# Patient Record
Sex: Female | Born: 1990 | Race: White | Hispanic: No | Marital: Single | Smoking: Never smoker
Health system: Southern US, Community
[De-identification: ages and names within clinical notes are randomized; demographics above are authoritative.]

## PROBLEM LIST (undated history)

## (undated) ENCOUNTER — Inpatient Hospital Stay (HOSPITAL_COMMUNITY): Payer: Self-pay

## (undated) DIAGNOSIS — B999 Unspecified infectious disease: Secondary | ICD-10-CM

## (undated) DIAGNOSIS — A599 Trichomoniasis, unspecified: Secondary | ICD-10-CM

## (undated) DIAGNOSIS — K219 Gastro-esophageal reflux disease without esophagitis: Secondary | ICD-10-CM

## (undated) HISTORY — PX: NO PAST SURGERIES: SHX2092

---

## 2015-08-30 ENCOUNTER — Encounter (HOSPITAL_COMMUNITY): Payer: Self-pay | Admitting: Emergency Medicine

## 2015-08-30 ENCOUNTER — Emergency Department (HOSPITAL_COMMUNITY): Payer: Medicaid Other

## 2015-08-30 ENCOUNTER — Emergency Department (HOSPITAL_COMMUNITY)
Admission: EM | Admit: 2015-08-30 | Discharge: 2015-08-30 | Disposition: A | Discharge status: Home / Self Care | Payer: Medicaid Other | Attending: Emergency Medicine | Admitting: Emergency Medicine

## 2015-08-30 DIAGNOSIS — M549 Dorsalgia, unspecified: Secondary | ICD-10-CM | POA: Diagnosis not present

## 2015-08-30 DIAGNOSIS — Z3A01 Less than 8 weeks gestation of pregnancy: Secondary | ICD-10-CM | POA: Diagnosis not present

## 2015-08-30 DIAGNOSIS — R109 Unspecified abdominal pain: Secondary | ICD-10-CM | POA: Insufficient documentation

## 2015-08-30 DIAGNOSIS — R110 Nausea: Secondary | ICD-10-CM | POA: Diagnosis not present

## 2015-08-30 DIAGNOSIS — Z3490 Encounter for supervision of normal pregnancy, unspecified, unspecified trimester: Secondary | ICD-10-CM

## 2015-08-30 DIAGNOSIS — O9989 Other specified diseases and conditions complicating pregnancy, childbirth and the puerperium: Secondary | ICD-10-CM | POA: Diagnosis not present

## 2015-08-30 DIAGNOSIS — R102 Pelvic and perineal pain: Secondary | ICD-10-CM | POA: Diagnosis not present

## 2015-08-30 LAB — URINALYSIS, ROUTINE W REFLEX MICROSCOPIC
Bilirubin Urine: NEGATIVE
Glucose, UA: NEGATIVE mg/dL
Hgb urine dipstick: NEGATIVE
Ketones, ur: 15 mg/dL — AB
Nitrite: NEGATIVE
Protein, ur: 30 mg/dL — AB
Specific Gravity, Urine: 1.02 (ref 1.005–1.030)
pH: 8 (ref 5.0–8.0)

## 2015-08-30 LAB — URINE MICROSCOPIC-ADD ON

## 2015-08-30 LAB — COMPREHENSIVE METABOLIC PANEL
ALT: 35 U/L (ref 14–54)
AST: 22 U/L (ref 15–41)
Albumin: 3.9 g/dL (ref 3.5–5.0)
Alkaline Phosphatase: 62 U/L (ref 38–126)
Anion gap: 8 (ref 5–15)
BUN: 5 mg/dL — ABNORMAL LOW (ref 6–20)
CO2: 22 mmol/L (ref 22–32)
Calcium: 9 mg/dL (ref 8.9–10.3)
Chloride: 108 mmol/L (ref 101–111)
Creatinine, Ser: 0.61 mg/dL (ref 0.44–1.00)
GFR calc Af Amer: >60 mL/min (ref >60)
GFR calc non Af Amer: >60 mL/min (ref >60)
Glucose, Bld: 91 mg/dL (ref 65–99)
Potassium: 3.6 mmol/L (ref 3.5–5.1)
Sodium: 138 mmol/L (ref 135–145)
Total Bilirubin: 0.6 mg/dL (ref 0.3–1.2)
Total Protein: 7 g/dL (ref 6.5–8.1)

## 2015-08-30 LAB — CBC
HCT: 40 % (ref 36.0–46.0)
Hemoglobin: 13.4 g/dL (ref 12.0–15.0)
MCH: 28.4 pg (ref 26.0–34.0)
MCHC: 33.5 g/dL (ref 30.0–36.0)
MCV: 84.7 fL (ref 78.0–100.0)
Platelets: 240 10*3/uL (ref 150–400)
RBC: 4.72 MIL/uL (ref 3.87–5.11)
RDW: 13.8 % (ref 11.5–15.5)
WBC: 7.3 10*3/uL (ref 4.0–10.5)

## 2015-08-30 LAB — LIPASE, BLOOD: Lipase: 29 U/L (ref 11–51)

## 2015-08-30 LAB — I-STAT BETA HCG BLOOD, ED (MC, WL, AP ONLY): I-stat hCG, quantitative: >2000 m[IU]/mL — ABNORMAL HIGH (ref <5)

## 2015-08-30 LAB — HCG, QUANTITATIVE, PREGNANCY: hCG, Beta Chain, Quant, S: 5378 m[IU]/mL — ABNORMAL HIGH (ref <5)

## 2015-08-30 MED ORDER — SODIUM CHLORIDE 0.9 % IV BOLUS (SEPSIS)
1000.0000 mL | Freq: Once | INTRAVENOUS | Status: AC
  Administered 2015-08-30: 1000 mL via INTRAVENOUS

## 2015-08-30 NOTE — Discharge Instructions (Signed)
Return here as needed.  [facility] with [facility].  Tylenol for pain

## 2015-08-30 NOTE — Progress Notes (Signed)
Entered in d/c instructions  Medicaid Covered Patient  USE THIS WEBSITE TO ASSIST WITH UNDERSTANDING YOUR COVERAGE, RENEW APPLICATION [place] Medicaid Transportation to Dr appts if you are have full Medicaid: [phone], [phone] or [phone] Transportation Supervisor [phone] As a Medicaid client you MUST contact DSS/SSI each time you change address, move to another [place] or another state to keep your address updated  [place]: [address] [place], [place] [address] [email or web address]

## 2015-08-30 NOTE — ED Notes (Signed)
Pt 6 weeks 4 days pregnant c/o nausea, diarrhea, and abdominal cramping to right abdomen radiating to back, worse at night, x 3 weeks.

## 2015-08-30 NOTE — ED Provider Notes (Signed)
CSN: [id number]     Arrival date & time 08/30/15  1135 History   First MD Initiated Contact with Patient 08/30/15 1347     Chief Complaint  Patient presents with  . Abdominal Cramping    pregnant and cramping     (Consider location/radiation/quality/duration/timing/severity/associated sxs/prior Treatment) HPI Patient presents to the emergency department with nausea and abdominal discomfort in the left lower pelvic region with pain in the back pain is worse at night.  Patient states that she did not take any medications prior to arrival for her symptoms.  Patient has not seen in GYN doctor about her current pregnancy.  Patient denies chest pain, shortness of breath, weakness, dizziness, headache, blurred vision, cough, fever, dysuria, incontinence, bloody stool, hematemesis, neck pain, rash, near syncope or syncope.  The patient states that palpation makes the pain worse History reviewed. No pertinent past medical history. History reviewed. No pertinent past surgical history. History reviewed. No pertinent family history. Social History  Substance Use Topics  . Smoking status: Never Smoker   . Smokeless tobacco: None  . Alcohol Use: No   OB History    Gravida Para Term Preterm AB TAB SAB Ectopic Multiple Living   2         1     Review of Systems  All other systems negative except as documented in the HPI. All pertinent positives and negatives as reviewed in the HPI.  Allergies  Review of patient's allergies indicates no known allergies.  Home Medications   Prior to Admission medications   Not on File   BP 115/77 mmHg  Pulse 58  Temp(Src) 98 F (36.7 C) (Oral)  Resp 14  SpO2 100%  LMP 07/15/2015 Physical Exam  Constitutional: She is oriented to person, place, and time. She appears well-developed and well-nourished. No distress.  HENT:  Head: Normocephalic and atraumatic.  Mouth/Throat: Oropharynx is clear and moist.  Eyes: Pupils are equal, round, and reactive to  light.  Neck: Normal range of motion. Neck supple.  Cardiovascular: Normal rate, regular rhythm and normal heart sounds.  Exam reveals no gallop and no friction rub.   No murmur heard. Pulmonary/Chest: Effort normal and breath sounds normal. No respiratory distress. She has no wheezes.  Abdominal: Soft. Normal appearance and bowel sounds are normal. She exhibits no distension. There is tenderness. There is no rebound and no guarding.    Neurological: She is alert and oriented to person, place, and time. She exhibits normal muscle tone. Coordination normal.  Skin: Skin is warm and dry. No rash noted. No erythema.  Psychiatric: She has a normal mood and affect. Her behavior is normal.  Nursing note and vitals reviewed.   ED Course  Procedures (including critical care time) Labs Review Labs Reviewed  COMPREHENSIVE METABOLIC PANEL - Abnormal; Notable for the following:    BUN 5 (*)    All other components within normal limits  I-STAT BETA HCG BLOOD, ED ([facility], [facility], [facility] ONLY) - Abnormal; Notable for the following:    I-stat hCG, quantitative >2000.0 (*)    All other components within normal limits  LIPASE, BLOOD  CBC  URINALYSIS, ROUTINE W REFLEX MICROSCOPIC (NOT AT [facility])  HCG, QUANTITATIVE, PREGNANCY    Imaging Review US Ob Comp Less 14 Wks  08/30/2015  CLINICAL DATA:  24 year old female with abdominal and left pelvic pain in the first trimester pregnancy. Initial encounter. EXAM: OBSTETRIC <14 WK [place] AND TRANSVAGINAL OB US TECHNIQUE: Both transabdominal and transvaginal ultrasound examinations were performed  for complete evaluation of the gestation as well as the maternal uterus, adnexal regions, and pelvic cul-de-sac. Transvaginal technique was performed to assess early pregnancy. COMPARISON:  None. FINDINGS: Intrauterine gestational sac: Single Yolk sac:  Visible Embryo:  Not visible Cardiac Activity: Not detected MSD: 9.2  mm   5 w   5  d Maternal uterus/adnexae: Possible small volume of  subchorionic hemorrhage (image 52). Lobulated appearance of the dorsal fundus such that an intramural fibroid is possible measuring about 2.8 cm (image 27). Trace simple appearing pelvic free fluid. The right ovary appears normal containing a simple cyst with no vascular elements. The right ovary measures 3.1 x 2.3 x 2.5 cm. This cyst is 14 mm. The left ovary is normal measuring 1.4 by 2.7 x 1.7 cm. IMPRESSION: 1. Probable early intrauterine gestational sac with yolk sac, but no fetal pole, or cardiac activity yet visualized. Recommend follow-up quantitative B-HCG levels and follow-up US in 14 days to confirm and assess viability. This recommendation follows SRU consensus guidelines: Diagnostic Criteria for Nonviable Pregnancy Early in the First Trimester. [employer] Med 2013KT:048977. 2. Small volume of subchorionic hemorrhage. Trace simple appearing pelvic free fluid. Normal ovaries. 3. Questionable intramural dorsal uterine fibroid measuring about 2.8 cm. Electronically Signed   By: Genevie Ann M.D.   On: 08/30/2015 15:20   US Ob Transvaginal  08/30/2015  CLINICAL DATA:  24 year old female with abdominal and left pelvic pain in the first trimester pregnancy. Initial encounter. EXAM: OBSTETRIC <14 WK [place] AND TRANSVAGINAL OB US TECHNIQUE: Both transabdominal and transvaginal ultrasound examinations were performed for complete evaluation of the gestation as well as the maternal uterus, adnexal regions, and pelvic cul-de-sac. Transvaginal technique was performed to assess early pregnancy. COMPARISON:  None. FINDINGS: Intrauterine gestational sac: Single Yolk sac:  Visible Embryo:  Not visible Cardiac Activity: Not detected MSD: 9.2  mm   5 w   5  d Maternal uterus/adnexae: Possible small volume of subchorionic hemorrhage (image 52). Lobulated appearance of the dorsal fundus such that an intramural fibroid is possible measuring about 2.8 cm (image 27). Trace simple appearing pelvic free fluid. The right ovary appears  normal containing a simple cyst with no vascular elements. The right ovary measures 3.1 x 2.3 x 2.5 cm. This cyst is 14 mm. The left ovary is normal measuring 1.4 by 2.7 x 1.7 cm. IMPRESSION: 1. Probable early intrauterine gestational sac with yolk sac, but no fetal pole, or cardiac activity yet visualized. Recommend follow-up quantitative B-HCG levels and follow-up US in 14 days to confirm and assess viability. This recommendation follows SRU consensus guidelines: Diagnostic Criteria for Nonviable Pregnancy Early in the First Trimester. [employer] Med 2013KT:048977. 2. Small volume of subchorionic hemorrhage. Trace simple appearing pelvic free fluid. Normal ovaries. 3. Questionable intramural dorsal uterine fibroid measuring about 2.8 cm. Electronically Signed   By: Genevie Ann M.D.   On: 08/30/2015 15:20   I have personally reviewed and evaluated these images and lab results as part of my medical decision-making.  Patient is advised him to follow-up with the next 2 weeks for reevaluation.  Told to return here as needed.  Patient is advised to go to [facility] hospital for any worsening in her condition.  Patient is advised to use Tylenol, increase her fluid intake, rest as much possible.  Patient agrees the plan and all questions were answered    Dalia Heading, PA-C 08/30/15 [place], MD 08/31/15 [phone]

## 2015-08-30 NOTE — Progress Notes (Signed)
Pt states she was in [place]  then moved to [place] and now is in [place] [employer] but confirms she has not changed her medicaid pcp to a [place] pcp  Pt encouraged to Please contact the Dept of Social services case worker to have a local medicaid accepting primary care doctor entered pror to going to one from list CM offered her

## 2015-09-01 LAB — URINE CULTURE

## 2015-09-01 NOTE — L&D Delivery Note (Signed)
Delivery Note At  a viable female was delivered via  (Presentation:vertex ;LOA  ).  APGAR:9 ,9 ; weight  .   Placenta status: spont,via shultz .  Cord:3vc  with the following complications:none .  Cord pH: n/a  Anesthesia: none  Episiotomy:none   Lacerations:  none Suture Repair: n/a Est. Blood Loss 100 (mL):    Mom to postpartum.  Baby to Couplet care / Skin to Skin.  Koren Shiver 04/12/2016, 2:03 PM

## 2015-09-09 ENCOUNTER — Other Ambulatory Visit: Payer: Self-pay | Admitting: General Practice

## 2015-09-09 DIAGNOSIS — Z3490 Encounter for supervision of normal pregnancy, unspecified, unspecified trimester: Secondary | ICD-10-CM

## 2015-09-10 ENCOUNTER — Ambulatory Visit (HOSPITAL_COMMUNITY)
Admission: RE | Admit: 2015-09-10 | Discharge: 2015-09-10 | Disposition: A | Discharge status: Home / Self Care | Payer: Medicaid Other | Source: Ambulatory Visit | Attending: Family Medicine | Admitting: Family Medicine

## 2015-09-10 DIAGNOSIS — Z3490 Encounter for supervision of normal pregnancy, unspecified, unspecified trimester: Secondary | ICD-10-CM

## 2015-09-10 DIAGNOSIS — Z36 Encounter for antenatal screening of mother: Secondary | ICD-10-CM | POA: Insufficient documentation

## 2015-09-11 ENCOUNTER — Telehealth: Payer: Self-pay | Admitting: *Deleted

## 2015-09-11 NOTE — Telephone Encounter (Signed)
Pt called to see if her radiology results are available yet.

## 2015-09-12 ENCOUNTER — Ambulatory Visit (HOSPITAL_COMMUNITY): Payer: Medicaid Other

## 2015-09-12 NOTE — Telephone Encounter (Signed)
-----   Message from Truett Mainland, DO sent at 09/11/2015  1:34 PM EST ----- Normal [place] with Viable pregnancy.  Should establish care in the next 4 weeks.

## 2015-09-12 NOTE — Telephone Encounter (Signed)
Pt informed of results. Advised her that the front desk will call her with an appointment to start care.

## 2015-09-13 ENCOUNTER — Other Ambulatory Visit: Payer: Medicaid Other

## 2015-09-20 ENCOUNTER — Encounter (HOSPITAL_COMMUNITY): Payer: Self-pay | Admitting: *Deleted

## 2015-09-20 ENCOUNTER — Inpatient Hospital Stay (HOSPITAL_COMMUNITY)
Admission: AD | Admit: 2015-09-20 | Discharge: 2015-09-20 | Disposition: A | Discharge status: Home / Self Care | Payer: Medicaid Other | Source: Ambulatory Visit | Attending: Obstetrics and Gynecology | Admitting: Obstetrics and Gynecology

## 2015-09-20 DIAGNOSIS — Z3A09 9 weeks gestation of pregnancy: Secondary | ICD-10-CM | POA: Diagnosis not present

## 2015-09-20 DIAGNOSIS — O219 Vomiting of pregnancy, unspecified: Secondary | ICD-10-CM

## 2015-09-20 DIAGNOSIS — O210 Mild hyperemesis gravidarum: Secondary | ICD-10-CM | POA: Diagnosis present

## 2015-09-20 DIAGNOSIS — O98811 Other maternal infectious and parasitic diseases complicating pregnancy, first trimester: Secondary | ICD-10-CM | POA: Insufficient documentation

## 2015-09-20 DIAGNOSIS — A599 Trichomoniasis, unspecified: Secondary | ICD-10-CM | POA: Insufficient documentation

## 2015-09-20 LAB — URINE MICROSCOPIC-ADD ON

## 2015-09-20 LAB — URINALYSIS, ROUTINE W REFLEX MICROSCOPIC
BILIRUBIN URINE: NEGATIVE
GLUCOSE, UA: NEGATIVE mg/dL
KETONES UR: 15 mg/dL — AB
NITRITE: NEGATIVE
PH: 6.5 (ref 5.0–8.0)
Protein, ur: NEGATIVE mg/dL

## 2015-09-20 MED ORDER — DOXYLAMINE-PYRIDOXINE 10-10 MG PO TBEC: 1.0000 | DELAYED_RELEASE_TABLET | Freq: Every day | ORAL | Status: DC

## 2015-09-20 MED ORDER — METRONIDAZOLE 500 MG PO TABS: ORAL_TABLET | ORAL | Status: DC

## 2015-09-20 MED ORDER — PROMETHAZINE HCL 25 MG PO TABS: 25.0000 mg | ORAL_TABLET | Freq: Four times a day (QID) | ORAL | Status: DC | PRN

## 2015-09-20 NOTE — MAU Note (Signed)
Pt presents to MAU with complaints of nausea and vomiting for three weeks. Pt states she had an appointment in the clinic and didn't ask for nausea medications because she was going to try and not take anything but now is unable to keep anything down.

## 2015-09-20 NOTE — MAU Provider Note (Signed)
Chief Complaint: Morning Sickness Provider saw patient at  1830 hrs 09/20/15      SUBJECTIVE  HPI Meredith Delacruz is a 25 y.o. G2P0 at [redacted]w[redacted]d by LMP who presents to maternity admissions reporting nausea and vomiting.  Was offered meds in clinic but was trying to go without.  But now it is worse and she wants meds. . She denies vaginal bleeding, vaginal itching/burning, urinary symptoms, h/a, dizziness, n/v, or fever/chills.    RN Note: Pt presents to MAU with complaints of nausea and vomiting for three weeks. Pt states she had an appointment in the clinic and didn't ask for nausea medications because she was going to try and not take anything but now is unable to keep anything down.       No past medical history on file. No past surgical history on file. Social History   Social History  . Marital Status: Single    Spouse Name: N/A  . Number of Children: N/A  . Years of Education: N/A   Occupational History  . Not on file.   Social History Main Topics  . Smoking status: Never Smoker   . Smokeless tobacco: Not on file  . Alcohol Use: No  . Drug Use: No  . Sexual Activity: Yes    Birth Control/ Protection: None   Other Topics Concern  . Not on file   Social History Narrative   No current facility-administered medications on file prior to encounter.   No current outpatient prescriptions on file prior to encounter.   No Known Allergies  I have reviewed patient's Past Medical Hx, Surgical Hx, Family Hx, Social Hx, medications and allergies.   ROS:  Review of Systems Review of Systems  Constitutional: Negative for fever and chills.  Gastrointestinal: Positive for nausea, vomiting,Negative for abdominal pain, diarrhea and constipation.  Genitourinary: Negative for dysuria.  Musculoskeletal: Negative for back pain.  Neurological: Negative for dizziness and weakness.    Physical Exam  Patient Vitals for the past 24 hrs:  BP Temp Pulse Resp Height Weight  09/20/15  1756 118/82 mmHg 97.4 F (36.3 C) 68 18 5\' 1"  (1.549 m) 69.4 kg (153 lb)  Physical Exam   Constitutional: Well-developed, well-nourished female in no acute distress.  Cardiovascular: normal rate Respiratory: normal effort GI: Abd soft, non-tender. Pos BS x 4 MS: Extremities nontender, no edema, normal ROM Neurologic: Alert and oriented x 4.  GU: Neg CVAT. PELVIC EXAM: deferred     LAB RESULTS Results for orders placed or performed during the hospital encounter of 09/20/15 (from the past 24 hour(s))  Urinalysis, Routine w reflex microscopic (not at [facility])     Status: Abnormal   Collection Time: 09/20/15  6:00 PM  Result Value Ref Range   Color, Urine YELLOW YELLOW   APPearance CLEAR CLEAR   Specific Gravity, Urine <1.005 (L) 1.005 - 1.030   pH 6.5 5.0 - 8.0   Glucose, UA NEGATIVE NEGATIVE mg/dL   Hgb urine dipstick TRACE (A) NEGATIVE   Bilirubin Urine NEGATIVE NEGATIVE   Ketones, ur 15 (A) NEGATIVE mg/dL   Protein, ur NEGATIVE NEGATIVE mg/dL   Nitrite NEGATIVE NEGATIVE   Leukocytes, UA MODERATE (A) NEGATIVE  Urine microscopic-add on     Status: Abnormal   Collection Time: 09/20/15  6:00 PM  Result Value Ref Range   Squamous Epithelial / LPF 0-5 (A) NONE SEEN   WBC, UA 0-5 0 - 5 WBC/hpf   RBC / HPF 0-5 0 - 5 RBC/hpf  Bacteria, UA RARE (A) NONE SEEN   Urine-Other TRICHOMONAS PRESENT        IMAGING US Ob Comp Less 14 Wks  08/30/2015  CLINICAL DATA:  25 year old female with abdominal and left pelvic pain in the first trimester pregnancy. Initial encounter. EXAM: OBSTETRIC <14 WK [place] AND TRANSVAGINAL OB US TECHNIQUE: Both transabdominal and transvaginal ultrasound examinations were performed for complete evaluation of the gestation as well as the maternal uterus, adnexal regions, and pelvic cul-de-sac. Transvaginal technique was performed to assess early pregnancy. COMPARISON:  None. FINDINGS: Intrauterine gestational sac: Single Yolk sac:  Visible Embryo:  Not visible Cardiac  Activity: Not detected MSD: 9.2  mm   5 w   5  d Maternal uterus/adnexae: Possible small volume of subchorionic hemorrhage (image 52). Lobulated appearance of the dorsal fundus such that an intramural fibroid is possible measuring about 2.8 cm (image 27). Trace simple appearing pelvic free fluid. The right ovary appears normal containing a simple cyst with no vascular elements. The right ovary measures 3.1 x 2.3 x 2.5 cm. This cyst is 14 mm. The left ovary is normal measuring 1.4 by 2.7 x 1.7 cm. IMPRESSION: 1. Probable early intrauterine gestational sac with yolk sac, but no fetal pole, or cardiac activity yet visualized. Recommend follow-up quantitative B-HCG levels and follow-up US in 14 days to confirm and assess viability. This recommendation follows SRU consensus guidelines: Diagnostic Criteria for Nonviable Pregnancy Early in the First Trimester. [employer] Med 2013KT:048977. 2. Small volume of subchorionic hemorrhage. Trace simple appearing pelvic free fluid. Normal ovaries. 3. Questionable intramural dorsal uterine fibroid measuring about 2.8 cm. Electronically Signed   By: Genevie Ann M.D.   On: 08/30/2015 15:20   US Ob Transvaginal  09/10/2015  CLINICAL DATA:  Pregnancy.  Evaluate for viability. EXAM: OBSTETRIC <14 WK [place] AND TRANSVAGINAL OB US TECHNIQUE: Both transabdominal and transvaginal ultrasound examinations were performed for complete evaluation of the gestation as well as the maternal uterus, adnexal regions, and pelvic cul-de-sac. Transvaginal technique was performed to assess early pregnancy. COMPARISON:  08/30/2015. FINDINGS: Intrauterine gestational sac: Visualized/normal in shape. Yolk sac:  Present. Embryo:  Present. Cardiac Activity: Present. Heart Rate: 143  bpm CRL:  0.8 cm 6 w   5 d                  [place] EDC: 04/30/2016 Subchorionic hemorrhage:  None visualized. Maternal uterus/adnexae: No prominent subchorionic hemorrhage. Small corpus luteal cyst noted right ovary. IMPRESSION: Single  viable intrauterine pregnancy at 6 weeks 5 days. Electronically Signed   By: Marcello Moores  Register   On: 09/10/2015 13:52   US Ob Transvaginal  08/30/2015  CLINICAL DATA:  25 year old female with abdominal and left pelvic pain in the first trimester pregnancy. Initial encounter. EXAM: OBSTETRIC <14 WK [place] AND TRANSVAGINAL OB US TECHNIQUE: Both transabdominal and transvaginal ultrasound examinations were performed for complete evaluation of the gestation as well as the maternal uterus, adnexal regions, and pelvic cul-de-sac. Transvaginal technique was performed to assess early pregnancy. COMPARISON:  None. FINDINGS: Intrauterine gestational sac: Single Yolk sac:  Visible Embryo:  Not visible Cardiac Activity: Not detected MSD: 9.2  mm   5 w   5  d Maternal uterus/adnexae: Possible small volume of subchorionic hemorrhage (image 52). Lobulated appearance of the dorsal fundus such that an intramural fibroid is possible measuring about 2.8 cm (image 27). Trace simple appearing pelvic free fluid. The right ovary appears normal containing a simple cyst with no vascular elements. The  right ovary measures 3.1 x 2.3 x 2.5 cm. This cyst is 14 mm. The left ovary is normal measuring 1.4 by 2.7 x 1.7 cm. IMPRESSION: 1. Probable early intrauterine gestational sac with yolk sac, but no fetal pole, or cardiac activity yet visualized. Recommend follow-up quantitative B-HCG levels and follow-up US in 14 days to confirm and assess viability. This recommendation follows SRU consensus guidelines: Diagnostic Criteria for Nonviable Pregnancy Early in the First Trimester. [employer] Med [id number]. 2. Small volume of subchorionic hemorrhage. Trace simple appearing pelvic free fluid. Normal ovaries. 3. Questionable intramural dorsal uterine fibroid measuring about 2.8 cm. Electronically Signed   By: Genevie Ann M.D.   On: 08/30/2015 15:20    MAU Management/MDM: Ordered labs and reviewed results.  .  Discussed treatment options for  N/V. Will try Diclegis first and Phenergan as backup plan Discussed Trich and partner treatment Pt stable at time of discharge.  ASSESSMENT SIUP at [redacted]w[redacted]d  Nausea and vomiting of pregnancy Trichomonal infection  PLAN Discharge home Rx Diclegis with instructions for nausea Rx Phenergan to use if Diclegis does not work Rx Flagyl 2gm PO x 1 Expedited partner therapy Rx and instructions given    Medication List    Notice    You have not been prescribed any medications.       Hansel Feinstein CNM, MSN Certified Nurse-Midwife 09/20/2015  6:17 PM

## 2015-09-20 NOTE — Discharge Instructions (Signed)
Morning Sickness Morning sickness is when you feel sick to your stomach (nauseous) during pregnancy. This nauseous feeling may or may not come with vomiting. It often occurs in the morning but can be a problem any time of day. Morning sickness is most common during the first trimester, but it may continue throughout pregnancy. While morning sickness is unpleasant, it is usually harmless unless you develop severe and continual vomiting (hyperemesis gravidarum). This condition requires more intense treatment.  CAUSES  The cause of morning sickness is not completely known but seems to be related to normal hormonal changes that occur in pregnancy. RISK FACTORS You are at greater risk if you:  Experienced nausea or vomiting before your pregnancy.  Had morning sickness during a previous pregnancy.  Are pregnant with more than one baby, such as twins. TREATMENT  Do not use any medicines (prescription, over-the-counter, or herbal) for morning sickness without first talking to your health care provider. Your health care provider may prescribe or recommend:  Vitamin B6 supplements.  Anti-nausea medicines.  The herbal medicine ginger. HOME CARE INSTRUCTIONS   Only take over-the-counter or prescription medicines as directed by your health care provider.  Taking multivitamins before getting pregnant can prevent or decrease the severity of morning sickness in most women.  Eat a piece of dry toast or unsalted crackers before getting out of bed in the morning.  Eat five or six small meals a day.  Eat dry and bland foods (rice, baked potato). Foods high in carbohydrates are often helpful.  Do not drink liquids with your meals. Drink liquids between meals.  Avoid greasy, fatty, and spicy foods.  Get someone to cook for you if the smell of any food causes nausea and vomiting.  If you feel nauseous after taking prenatal vitamins, take the vitamins at night or with a snack.  Snack on protein  foods (nuts, yogurt, cheese) between meals if you are hungry.  Eat unsweetened gelatins for desserts.  Wearing an acupressure wristband (worn for sea sickness) may be helpful.  Acupuncture may be helpful.  Do not smoke.  Get a humidifier to keep the air in your house free of odors.  Get plenty of fresh air. SEEK MEDICAL CARE IF:   Your home remedies are not working, and you need medicine.  You feel dizzy or lightheaded.  You are losing weight. SEEK IMMEDIATE MEDICAL CARE IF:   You have persistent and uncontrolled nausea and vomiting.  You pass out (faint). MAKE SURE YOU:  Understand these instructions.  Will watch your condition.  Will get help right away if you are not doing well or get worse.   This information is not intended to replace advice given to you by your health care provider. Make sure you discuss any questions you have with your health care provider.   Document Released: 10/08/2006 Document Revised: 08/22/2013 Document Reviewed: 02/01/2013 Elsevier Interactive Patient Education 2016 [employer].  Trichomoniasis Trichomoniasis is an infection caused by an organism called Trichomonas. The infection can affect both women and men. In women, the outer female genitalia and the vagina are affected. In men, the penis is mainly affected, but the prostate and other reproductive organs can also be involved. Trichomoniasis is a sexually transmitted infection (STI) and is most often passed to another person through sexual contact.  RISK FACTORS  Having unprotected sexual intercourse.  Having sexual intercourse with an infected partner. SIGNS AND SYMPTOMS  Symptoms of trichomoniasis in women include:  Abnormal gray-green frothy vaginal discharge.  Itching and irritation of the vagina.  Itching and irritation of the area outside the vagina. Symptoms of trichomoniasis in men include:   Penile discharge with or without pain.  Pain during urination. This results  from inflammation of the urethra. DIAGNOSIS  Trichomoniasis may be found during a Pap test or physical exam. Your health care provider may use one of the following methods to help diagnose this infection:  Testing the pH of the vagina with a test tape.  Using a vaginal swab test that checks for the Trichomonas organism. A test is available that provides results within a few minutes.  Examining a urine sample.  Testing vaginal secretions. Your health care provider may test you for other STIs, including HIV. TREATMENT   You may be given medicine to fight the infection. Women should inform their health care provider if they could be or are pregnant. Some medicines used to treat the infection should not be taken during pregnancy.  Your health care provider may recommend over-the-counter medicines or creams to decrease itching or irritation.  Your sexual partner will need to be treated if infected.  Your health care provider may test you for infection again 3 months after treatment. HOME CARE INSTRUCTIONS   Take medicines only as directed by your health care provider.  Take over-the-counter medicine for itching or irritation as directed by your health care provider.  Do not have sexual intercourse while you have the infection.  Women should not douche or wear tampons while they have the infection.  Discuss your infection with your partner. Your partner may have gotten the infection from you, or you may have gotten it from your partner.  Have your sex partner get examined and treated if necessary.  Practice safe, informed, and protected sex.  See your health care provider for other STI testing. SEEK MEDICAL CARE IF:   You still have symptoms after you finish your medicine.  You develop abdominal pain.  You have pain when you urinate.  You have bleeding after sexual intercourse.  You develop a rash.  Your medicine makes you sick or makes you throw up (vomit). MAKE SURE  YOU:  Understand these instructions.  Will watch your condition.  Will get help right away if you are not doing well or get worse.   This information is not intended to replace advice given to you by your health care provider. Make sure you discuss any questions you have with your health care provider.   Document Released: 02/10/2001 Document Revised: 09/07/2014 Document Reviewed: 05/29/2013 Elsevier Interactive Patient Education 2016 [employer].  Expedited Partner Therapy:  Information Sheet for Patients and Partners               You have been offered expedited partner therapy (EPT). This information sheet contains important information and warnings you need to be aware of, so please read it carefully.   Expedited Partner Therapy (EPT) is the clinical practice of treating the sexual partners of persons who receive chlamydia, gonorrhea, or trichomoniasis diagnoses by providing medications or prescriptions to the patient. Patients then provide partners with these therapies without the health-care provider having examined the partner. In other words, EPT is a convenient, fast and private way for patients to help their sexual partners get treated.   Chlamydia and gonorrhea are bacterial infections you get from having sex with a person who is already infected. Trichomoniasis (or trich) is a very common sexually transmitted infection (STI) that is caused by infection with a protozoan  parasite called Trichomonas vaginalis.  Many people with these infections dont know it because they feel fine, but without treatment these infections can cause serious health problems, such as pelvic inflammatory disease, ectopic pregnancy, infertility and increased risk of HIV.   It is important to get treated as soon as possible to protect your health, to avoid spreading these infections to others, and to prevent yourself from becoming re-infected. The good news is these infections can be easily cured with  proper antibiotic medicine. The best way to take care of your self is to see a doctor or go to your local health department. If you are not able to see a doctor or other medical provider, you should take EPT.    Recommended Medication: EPT for Chlamydia:  Azithromycin (Zithromax) 1 gram orally in a single dose EPT for Gonorrhea:  Cefixime (Suprax) 400 milligrams orally in a single dose PLUS azithromycin (Zithromax) 1 gram orally in a single dose EPT for Trichomoniasis:  Metronidazole (Flagyl) 2 grams orally in a single dose   These medicines are very safe. However, you should not take them if you have ever had an allergic reaction (like a rash) to any of these medicines: azithromycin (Zithromax), erythromycin, clarithromycin (Biaxin), metronidazole (Flagyl), tinidazole (Tindimax). If you are uncertain about whether you have an allergy, call your medical provider or pharmacist before taking this medicine. If you have a serious, long-term illness like kidney, liver or heart disease, colitis or stomach problems, or you are currently taking other prescription medication, talk to your provider before taking this medication.   Women: If you have lower belly pain, pain during sex, vomiting, or a fever, do not take this medicine. Instead, you should see a medical provider to be certain you do not have pelvic inflammatory disease (PID). PID can be serious and lead to infertility, pregnancy problems or chronic pelvic pain.   Pregnant Women: It is very important for you to see a doctor to get pregnancy services and pre-natal care. These antibiotics for EPT are safe for pregnant women, but you still need to see a medical provider as soon as possible. It is also important to note that Doxycycline is an alternative therapy for chlamydia, but it should not be taken by someone who is pregnant.   Men: If you have pain or swelling in the testicles or a fever, do not take this medicine and see a medical provider.      Men who have sex with men (MSM): MSM in [place] continue to experience high rates of syphilis and HIV. Many MSM with gonorrhea or chlamydia could also have syphilis and/or HIV and not know it. If you are a man who has sex with other men, it is very important that you see a medical provider and are tested for HIV and syphilis. EPT is not recommended for gonorrhea for MSM.  Recommended treatment for gonorrhea for MSM is Rocephin (shot) AND azithromycin due to decreased cure rate.  Please see your medical provider if this is the case.    Along with this information sheet is a prescription for the medicine. If you receive a prescription it will be in your name and will indicate your date of birth, or it will be in the name of Expedited Partner Therapy.   In either case, you can have the prescription filled at a pharmacy. You will be responsible for the cost of the medicine, unless you have prescription drug coverage. In that case, you could provide your name  so the pharmacy could bill your health plan.   Take the medication as directed. Some people will have a mild, upset stomach, which does not last long. AVOID alcohol 24 hours after taking metronidazole (Flagyl) to reduce the possibility of a disulfiram-like reaction (severe vomiting and abdominal pain).  After taking the medicine, do not have sex for 7 days. Do not share this medicine or give it to anyone else. It is important to tell everyone you have had sex with in the last 60 days that they need to go and get tested for sexually transmitted infections.   Ways to prevent these and other sexually transmitted infections (STIs):    Abstain from sex. This is the only sure way to avoid getting an STI.   Use barrier methods, such as condoms, consistently and correctly.   Limit the number of sexual partners.   Have regular physical exams, including testing for STIs.   For more information about EPT or other issues pertaining to an STI,  please contact your medical provider or the [facility] Department at [phone] or [email or web address]

## 2015-09-24 ENCOUNTER — Encounter: Payer: Self-pay | Admitting: *Deleted

## 2015-10-08 ENCOUNTER — Ambulatory Visit (INDEPENDENT_AMBULATORY_CARE_PROVIDER_SITE_OTHER): Payer: Medicaid Other | Admitting: Advanced Practice Midwife

## 2015-10-08 ENCOUNTER — Encounter: Payer: Self-pay | Admitting: Advanced Practice Midwife

## 2015-10-08 ENCOUNTER — Encounter: Payer: Self-pay | Admitting: Family

## 2015-10-08 ENCOUNTER — Encounter: Payer: Medicaid Other | Admitting: Family

## 2015-10-08 VITALS — BP 115/82 | HR 89 | Temp 97.8°F | Wt 153.8 lb

## 2015-10-08 DIAGNOSIS — O23599 Infection of other part of genital tract in pregnancy, unspecified trimester: Secondary | ICD-10-CM

## 2015-10-08 DIAGNOSIS — Z113 Encounter for screening for infections with a predominantly sexual mode of transmission: Secondary | ICD-10-CM

## 2015-10-08 DIAGNOSIS — A5901 Trichomonal vulvovaginitis: Secondary | ICD-10-CM

## 2015-10-08 DIAGNOSIS — Z3481 Encounter for supervision of other normal pregnancy, first trimester: Secondary | ICD-10-CM

## 2015-10-08 DIAGNOSIS — O23591 Infection of other part of genital tract in pregnancy, first trimester: Secondary | ICD-10-CM

## 2015-10-08 DIAGNOSIS — Z124 Encounter for screening for malignant neoplasm of cervix: Secondary | ICD-10-CM

## 2015-10-08 LAB — POCT URINALYSIS DIP (DEVICE)
Bilirubin Urine: NEGATIVE
GLUCOSE, UA: NEGATIVE mg/dL
Hgb urine dipstick: NEGATIVE
Ketones, ur: NEGATIVE mg/dL
Nitrite: NEGATIVE
PROTEIN: NEGATIVE mg/dL
SPECIFIC GRAVITY, URINE: 1.015 (ref 1.005–1.030)
UROBILINOGEN UA: 0.2 mg/dL (ref 0.0–1.0)
pH: 6 (ref 5.0–8.0)

## 2015-10-08 NOTE — Progress Notes (Signed)
Here for prenatal visit. Declines flu shot.

## 2015-10-08 NOTE — Progress Notes (Signed)
Subjective:  Meredith Delacruz is a 25 y.o. G2P1001 at [redacted]w[redacted]d being seen today for initial prenatal visit.  She is currently monitored for the following issues for this low-risk pregnancy and has Supervision of normal pregnancy, antepartum and Trichomonal vaginitis during pregnancy on her problem list.  Patient reports no complaints.  Contractions: Not present. Vag. Bleeding: None.  Movement: Absent. Denies leaking of fluid.   The following portions of the patient's history were reviewed and updated as appropriate: allergies, current medications, past family history, past medical history, past social history, past surgical history and problem list. Problem list updated.  Objective:   Filed Vitals:   10/08/15 1241  BP: 115/82  Pulse: 89  Temp: 97.8 F (36.6 C)  Weight: 153 lb 12.8 oz (69.763 kg)    Fetal Status: Fetal Heart Rate (bpm): 166   Movement: Absent     General:  Alert, oriented and cooperative. Patient is in no acute distress.  Skin: Skin is warm and dry. No rash noted.   Cardiovascular: Normal heart rate noted  Respiratory: Normal respiratory effort, no problems with respiration noted  Abdomen: Soft, gravid, appropriate for gestational age. Pain/Pressure: Absent     Pelvic: Vag. Bleeding: None     Cervical exam deferred        Extremities: Normal range of motion.  Edema: None  Mental Status: Normal mood and affect. Normal behavior. Normal judgment and thought content.   Urinalysis: Urine Protein: Negative Urine Glucose: Negative  Assessment and Plan:  Pregnancy: G2P1001 at [redacted]w[redacted]d  1. Supervision of normal pregnancy, antepartum, first trimester  - Prenatal Profile - Culture, OB Urine - Prescript Monitor Profile(19) - Cytology - PAP - GC/Chlamydia probe amp ([place])not at [facility] --Discussed genetic screening as optional testing with pt.  Discussed risks of false positives and anxiety awaiting test results.  Discussed benefits of testing including early knowledge of  problems/anomalies and time for termination if desired. Discussed alternatives include diagnostic testing including amniocentesis and NIPS or choosing to decline genetic screening by blood work.  Pt aware that anatomy US is form of genetic screening with lower accuracy in detecting trisomies than blood work.  Pt chooses genetic screening today. - [place] MFM Fetal Nuchal Translucency; Future  2. Trichomonal vaginitis during pregnancy, first trimester --Diagnosed in first trimester, Rx for Flagyl.  Taken 09/30/15.  Partner treated also.  Too early for TOC today.  Needs wet prep at next visit.  Preterm labor symptoms and general obstetric precautions including but not limited to vaginal bleeding, contractions, leaking of fluid and fetal movement were reviewed in detail with the patient. Please refer to After Visit Summary for other counseling recommendations.  Return in about 4 weeks (around 11/05/2015).   Elvera Maria, CNM

## 2015-10-09 LAB — PRENATAL PROFILE (SOLSTAS)
ANTIBODY SCREEN: NEGATIVE
BASOS ABS: 0 10*3/uL (ref 0.0–0.1)
BASOS PCT: 0 % (ref 0–1)
EOS ABS: 0 10*3/uL (ref 0.0–0.7)
EOS PCT: 0 % (ref 0–5)
HCT: 38.2 % (ref 36.0–46.0)
HEMOGLOBIN: 13 g/dL (ref 12.0–15.0)
HIV 1&2 Ab, 4th Generation: NONREACTIVE
Hepatitis B Surface Ag: NEGATIVE
LYMPHS ABS: 1.7 10*3/uL (ref 0.7–4.0)
Lymphocytes Relative: 18 % (ref 12–46)
MCH: 28.5 pg (ref 26.0–34.0)
MCHC: 34 g/dL (ref 30.0–36.0)
MCV: 83.8 fL (ref 78.0–100.0)
MPV: 10.4 fL (ref 8.6–12.4)
Monocytes Absolute: 0.4 10*3/uL (ref 0.1–1.0)
Monocytes Relative: 4 % (ref 3–12)
NEUTROS PCT: 78 % — AB (ref 43–77)
Neutro Abs: 7.3 10*3/uL (ref 1.7–7.7)
PLATELETS: 233 10*3/uL (ref 150–400)
RBC: 4.56 MIL/uL (ref 3.87–5.11)
RDW: 14.2 % (ref 11.5–15.5)
RH TYPE: POSITIVE
RUBELLA: 1.46 {index} — AB (ref <0.90)
WBC: 9.3 10*3/uL (ref 4.0–10.5)

## 2015-10-09 LAB — GC/CHLAMYDIA PROBE AMP (~~LOC~~) NOT AT ARMC
CHLAMYDIA, DNA PROBE: NEGATIVE
NEISSERIA GONORRHEA: NEGATIVE

## 2015-10-10 LAB — PRESCRIPTION MONITORING PROFILE (19 PANEL)
AMPHETAMINE/METH: NEGATIVE ng/mL
Barbiturate Screen, Urine: NEGATIVE ng/mL
Benzodiazepine Screen, Urine: NEGATIVE ng/mL
Buprenorphine, Urine: NEGATIVE ng/mL
CARISOPRODOL, URINE: NEGATIVE ng/mL
Cannabinoid Scrn, Ur: NEGATIVE ng/mL
Cocaine Metabolites: NEGATIVE ng/mL
Creatinine, Urine: 102.82 mg/dL (ref >20.0)
Fentanyl, Ur: NEGATIVE ng/mL
MDMA URINE: NEGATIVE ng/mL
MEPERIDINE UR: NEGATIVE ng/mL
METHADONE SCREEN, URINE: NEGATIVE ng/mL
Methaqualone: NEGATIVE ng/mL
NITRITES URINE, INITIAL: NEGATIVE ug/mL
OXYCODONE SCRN UR: NEGATIVE ng/mL
Opiate Screen, Urine: NEGATIVE ng/mL
PROPOXYPHENE: NEGATIVE ng/mL
Phencyclidine, Ur: NEGATIVE ng/mL
TAPENTADOLUR: NEGATIVE ng/mL
TRAMADOL UR: NEGATIVE ng/mL
Zolpidem, Urine: NEGATIVE ng/mL
pH, Initial: 6.2 pH (ref 4.5–8.9)

## 2015-10-10 LAB — CYTOLOGY - PAP

## 2015-10-11 ENCOUNTER — Encounter: Payer: Self-pay | Admitting: Advanced Practice Midwife

## 2015-10-11 DIAGNOSIS — O2340 Unspecified infection of urinary tract in pregnancy, unspecified trimester: Secondary | ICD-10-CM

## 2015-10-11 DIAGNOSIS — B951 Streptococcus, group B, as the cause of diseases classified elsewhere: Secondary | ICD-10-CM | POA: Insufficient documentation

## 2015-10-11 LAB — CULTURE, OB URINE

## 2015-10-21 ENCOUNTER — Ambulatory Visit (HOSPITAL_COMMUNITY)
Admission: RE | Admit: 2015-10-21 | Discharge: 2015-10-21 | Disposition: A | Discharge status: Home / Self Care | Payer: Medicaid Other | Source: Ambulatory Visit | Attending: Advanced Practice Midwife | Admitting: Advanced Practice Midwife

## 2015-10-21 ENCOUNTER — Other Ambulatory Visit: Payer: Self-pay | Admitting: Advanced Practice Midwife

## 2015-10-21 ENCOUNTER — Encounter (HOSPITAL_COMMUNITY): Payer: Self-pay

## 2015-10-21 DIAGNOSIS — Z3A12 12 weeks gestation of pregnancy: Secondary | ICD-10-CM

## 2015-10-21 DIAGNOSIS — Z36 Encounter for antenatal screening of mother: Secondary | ICD-10-CM | POA: Insufficient documentation

## 2015-10-21 DIAGNOSIS — Z369 Encounter for antenatal screening, unspecified: Secondary | ICD-10-CM

## 2015-10-21 DIAGNOSIS — Z3481 Encounter for supervision of other normal pregnancy, first trimester: Secondary | ICD-10-CM

## 2015-10-29 ENCOUNTER — Encounter: Payer: Self-pay | Admitting: *Deleted

## 2015-10-29 DIAGNOSIS — Z3480 Encounter for supervision of other normal pregnancy, unspecified trimester: Secondary | ICD-10-CM

## 2015-10-30 ENCOUNTER — Other Ambulatory Visit (HOSPITAL_COMMUNITY): Payer: Self-pay

## 2015-11-01 ENCOUNTER — Inpatient Hospital Stay (HOSPITAL_COMMUNITY)
Admission: AD | Admit: 2015-11-01 | Discharge: 2015-11-01 | Disposition: A | Discharge status: Home / Self Care | Payer: Medicaid Other | Source: Ambulatory Visit | Attending: Obstetrics & Gynecology | Admitting: Obstetrics & Gynecology

## 2015-11-01 ENCOUNTER — Encounter (HOSPITAL_COMMUNITY): Payer: Self-pay | Admitting: *Deleted

## 2015-11-01 DIAGNOSIS — J029 Acute pharyngitis, unspecified: Secondary | ICD-10-CM | POA: Diagnosis not present

## 2015-11-01 DIAGNOSIS — O9989 Other specified diseases and conditions complicating pregnancy, childbirth and the puerperium: Secondary | ICD-10-CM

## 2015-11-01 DIAGNOSIS — O26892 Other specified pregnancy related conditions, second trimester: Secondary | ICD-10-CM | POA: Diagnosis not present

## 2015-11-01 DIAGNOSIS — O98312 Other infections with a predominantly sexual mode of transmission complicating pregnancy, second trimester: Secondary | ICD-10-CM

## 2015-11-01 DIAGNOSIS — O23591 Infection of other part of genital tract in pregnancy, first trimester: Secondary | ICD-10-CM

## 2015-11-01 DIAGNOSIS — A5901 Trichomonal vulvovaginitis: Secondary | ICD-10-CM

## 2015-11-01 DIAGNOSIS — Z3A14 14 weeks gestation of pregnancy: Secondary | ICD-10-CM

## 2015-11-01 DIAGNOSIS — Z3A15 15 weeks gestation of pregnancy: Secondary | ICD-10-CM | POA: Diagnosis not present

## 2015-11-01 DIAGNOSIS — Z3480 Encounter for supervision of other normal pregnancy, unspecified trimester: Secondary | ICD-10-CM

## 2015-11-01 LAB — RAPID STREP SCREEN (MED CTR MEBANE ONLY): Streptococcus, Group A Screen (Direct): NEGATIVE

## 2015-11-01 MED ORDER — ACETAMINOPHEN 500 MG PO TABS
1000.0000 mg | ORAL_TABLET | Freq: Once | ORAL | Status: AC
  Administered 2015-11-01: 1000 mg via ORAL
  Filled 2015-11-01: qty 2

## 2015-11-01 NOTE — MAU Provider Note (Signed)
  History     CSN: [id number]  Arrival date and time: 11/01/15 0156   First Provider Initiated Contact with Patient 11/01/15 0220      Chief Complaint  Patient presents with  . Sore Throat   Sore Throat  This is a new problem. Episode onset: three days ago.  The problem has been unchanged. Neither side of throat is experiencing more pain than the other. There has been no fever. The pain is at a severity of 8/10. Associated symptoms include abdominal pain and diarrhea. Pertinent negatives include no vomiting. Exposure to: unknown exposures. Patient works at [employer]. . She has tried nothing for the symptoms.    History reviewed. No pertinent past medical history.  History reviewed. No pertinent past surgical history.  History reviewed. No pertinent family history.  Social History  Substance Use Topics  . Smoking status: Never Smoker   . Smokeless tobacco: Never Used  . Alcohol Use: Yes     Comment: socially before pregnancy    Allergies: No Known Allergies  Prescriptions prior to admission  Medication Sig Dispense Refill Last Dose  . Doxylamine-Pyridoxine (DICLEGIS) 10-10 MG TBEC Take 1 tablet by mouth at bedtime. (Patient not taking: Reported on 10/08/2015) 100 tablet 1 Not Taking  . flintstones complete (FLINTSTONES) 60 MG chewable tablet Chew 2 tablets by mouth daily.   Taking  . promethazine (PHENERGAN) 25 MG tablet Take 1 tablet (25 mg total) by mouth every 6 (six) hours as needed for nausea or vomiting. 30 tablet 2 Taking    Review of Systems  Constitutional: Negative for fever and chills.  Gastrointestinal: Positive for abdominal pain and diarrhea. Negative for nausea and vomiting.  Genitourinary: Negative for dysuria, urgency and frequency.   Physical Exam   Blood pressure 111/72, pulse 107, temperature 97.9 F (36.6 C), temperature source Oral, resp. rate 18, height 5' (1.524 m), weight 69.4 kg (153 lb), last menstrual period 07/15/2015.  Physical Exam  Nursing  note and vitals reviewed. Constitutional: She is oriented to person, place, and time. She appears well-developed and well-nourished. No distress.  HENT:  Head: Normocephalic.  Mouth/Throat: Oropharynx is clear and moist. No oropharyngeal exudate.  GI: Soft. There is no tenderness.  Musculoskeletal: Normal range of motion.  Lymphadenopathy:    She has no cervical adenopathy.  Neurological: She is alert and oriented to person, place, and time.  Skin: Skin is warm and dry.  Psychiatric: She has a normal mood and affect.    MAU Course  Procedures  MDM Strep culture obtained Given dose of tylenol for pain prior to dc home.  Assessment and Plan   1. Supervision of normal pregnancy, antepartum, unspecified trimester   2. Trichomonal vaginitis during pregnancy, first trimester   3. Sore throat    DC home Comfort measures reviewed  Strep culture pending  RX: none  Return to MAU as needed FU with OB as planned  Follow-up Information    Follow up with [facility].   Specialty:  Obstetrics and Gynecology   Why:  As scheduled   Contact information:   [address] [place] [phone]        Mathis Bud 11/01/2015, 2:21 AM

## 2015-11-01 NOTE — MAU Note (Signed)
PT SAYS  SHE HAS DIARRHEA-  STARTED  WED,     SORE THROAT- STARTED  Thursday,       CHILLS-  STARTED  WED-   NO FEVER  AT HOME.    NO VOMITING.  NO NAUSEA.  NO COUGH.   [place]-  IN  CLINIC.   HAS FELT  DIZZY.

## 2015-11-01 NOTE — Discharge Instructions (Signed)

## 2015-11-03 LAB — CULTURE, GROUP A STREP (THRC)

## 2015-11-05 ENCOUNTER — Ambulatory Visit (INDEPENDENT_AMBULATORY_CARE_PROVIDER_SITE_OTHER): Payer: Medicaid Other | Admitting: Certified Nurse Midwife

## 2015-11-05 VITALS — BP 117/92 | HR 82 | Temp 98.7°F | Wt 152.4 lb

## 2015-11-05 DIAGNOSIS — J988 Other specified respiratory disorders: Secondary | ICD-10-CM

## 2015-11-05 DIAGNOSIS — Z3482 Encounter for supervision of other normal pregnancy, second trimester: Secondary | ICD-10-CM | POA: Diagnosis not present

## 2015-11-05 LAB — POCT URINALYSIS DIP (DEVICE)
Bilirubin Urine: NEGATIVE
Glucose, UA: NEGATIVE mg/dL
Hgb urine dipstick: NEGATIVE
Ketones, ur: NEGATIVE mg/dL
Nitrite: NEGATIVE
Protein, ur: NEGATIVE mg/dL
Specific Gravity, Urine: 1.015 (ref 1.005–1.030)
Urobilinogen, UA: 0.2 mg/dL (ref 0.0–1.0)
pH: 7.5 (ref 5.0–8.0)

## 2015-11-05 MED ORDER — AZITHROMYCIN 250 MG PO TABS: 250.0000 mg | ORAL_TABLET | Freq: Every day | ORAL | Status: DC

## 2015-11-05 NOTE — Addendum Note (Signed)
Addended by: Michel Harrow on: 11/05/2015 02:17 PM   Modules accepted: Orders

## 2015-11-05 NOTE — Progress Notes (Signed)
Pt c/o uri symptoms x4 days Breastfeeding tip of the week reviewed

## 2015-11-05 NOTE — Patient Instructions (Signed)

## 2015-11-07 LAB — CULTURE, OB URINE: Colony Count: 75000

## 2015-11-07 NOTE — Progress Notes (Signed)
Late addendum: 11/07/15 2:20pm completed medicaid home form.

## 2015-12-02 ENCOUNTER — Encounter: Payer: Medicaid Other | Admitting: Advanced Practice Midwife

## 2015-12-03 ENCOUNTER — Encounter: Payer: Medicaid Other | Admitting: Family

## 2015-12-09 ENCOUNTER — Other Ambulatory Visit (HOSPITAL_COMMUNITY)
Admission: RE | Admit: 2015-12-09 | Discharge: 2015-12-09 | Disposition: A | Discharge status: Home / Self Care | Payer: Medicaid Other | Source: Ambulatory Visit | Attending: Obstetrics and Gynecology | Admitting: Obstetrics and Gynecology

## 2015-12-09 ENCOUNTER — Ambulatory Visit (HOSPITAL_COMMUNITY)
Admission: RE | Admit: 2015-12-09 | Discharge: 2015-12-09 | Disposition: A | Discharge status: Home / Self Care | Payer: Medicaid Other | Source: Ambulatory Visit | Attending: Certified Nurse Midwife | Admitting: Certified Nurse Midwife

## 2015-12-09 ENCOUNTER — Ambulatory Visit (INDEPENDENT_AMBULATORY_CARE_PROVIDER_SITE_OTHER): Payer: Medicaid Other | Admitting: Obstetrics and Gynecology

## 2015-12-09 VITALS — BP 115/66 | HR 70 | Temp 98.2°F | Wt 152.7 lb

## 2015-12-09 DIAGNOSIS — Z3A19 19 weeks gestation of pregnancy: Secondary | ICD-10-CM | POA: Diagnosis not present

## 2015-12-09 DIAGNOSIS — O23591 Infection of other part of genital tract in pregnancy, first trimester: Secondary | ICD-10-CM

## 2015-12-09 DIAGNOSIS — Z3482 Encounter for supervision of other normal pregnancy, second trimester: Secondary | ICD-10-CM

## 2015-12-09 DIAGNOSIS — A5901 Trichomonal vulvovaginitis: Secondary | ICD-10-CM

## 2015-12-09 DIAGNOSIS — N760 Acute vaginitis: Secondary | ICD-10-CM | POA: Insufficient documentation

## 2015-12-09 DIAGNOSIS — Z36 Encounter for antenatal screening of mother: Secondary | ICD-10-CM | POA: Insufficient documentation

## 2015-12-09 LAB — POCT URINALYSIS DIP (DEVICE)
Bilirubin Urine: NEGATIVE
Glucose, UA: NEGATIVE mg/dL
Hgb urine dipstick: NEGATIVE
KETONES UR: NEGATIVE mg/dL
Nitrite: NEGATIVE
PH: 5.5 (ref 5.0–8.0)
PROTEIN: NEGATIVE mg/dL
Urobilinogen, UA: 0.2 mg/dL (ref 0.0–1.0)

## 2015-12-09 NOTE — Progress Notes (Signed)
Subjective:  Meredith Delacruz is a 25 y.o. G2P1001 at [redacted]w[redacted]d being seen today for ongoing prenatal care.  She is currently monitored for the following issues for this low-risk pregnancy and has Supervision of normal pregnancy, antepartum; Trichomonal vaginitis during pregnancy; and GBS (group B streptococcus) UTI complicating pregnancy on her problem list.  Patient reports no complaints.  Contractions: Not present. Vag. Bleeding: None.  Movement: Present. Denies leaking of fluid.   The following portions of the patient's history were reviewed and updated as appropriate: allergies, current medications, past family history, past medical history, past social history, past surgical history and problem list. Problem list updated.  Objective:   Filed Vitals:   12/09/15 0805  BP: 115/66  Pulse: 70  Temp: 98.2 F (36.8 C)  Weight: 152 lb 11.2 oz (69.264 kg)    Fetal Status: Fetal Heart Rate (bpm): 141   Movement: Present     General:  Alert, oriented and cooperative. Patient is in no acute distress.  Skin: Skin is warm and dry. No rash noted.   Cardiovascular: Normal heart rate noted  Respiratory: Normal respiratory effort, no problems with respiration noted  Abdomen: Soft, gravid, appropriate for gestational age. Pain/Pressure: Absent     Pelvic: Vag. Bleeding: None     Cervical exam deferred        Extremities: Normal range of motion.  Edema: None  Mental Status: Normal mood and affect. Normal behavior. Normal judgment and thought content.   Urinalysis: Urine Protein: Negative Urine Glucose: Negative  Assessment and Plan:  Pregnancy: G2P1001 at [redacted]w[redacted]d  1. Supervision of normal pregnancy, antepartum, second trimester - anatomy scan today  2. Trich - toc today (naat)  Preterm labor symptoms and general obstetric precautions including but not limited to vaginal bleeding, contractions, leaking of fluid and fetal movement were reviewed in detail with the patient. Please refer to After  Visit Summary for other counseling recommendations.  F/u 1 week  Gwynne Edinger, MD

## 2015-12-09 NOTE — Progress Notes (Signed)
Educated pt on Benefits of Breastfeeding for Baby 

## 2015-12-10 LAB — CERVICOVAGINAL ANCILLARY ONLY: TRICH (WINDOWPATH): NEGATIVE

## 2016-01-06 ENCOUNTER — Ambulatory Visit (INDEPENDENT_AMBULATORY_CARE_PROVIDER_SITE_OTHER): Payer: Medicaid Other | Admitting: Obstetrics & Gynecology

## 2016-01-06 VITALS — BP 109/68 | HR 80 | Wt 160.4 lb

## 2016-01-06 DIAGNOSIS — Z3482 Encounter for supervision of other normal pregnancy, second trimester: Secondary | ICD-10-CM

## 2016-01-06 LAB — POCT URINALYSIS DIP (DEVICE)
BILIRUBIN URINE: NEGATIVE
GLUCOSE, UA: NEGATIVE mg/dL
HGB URINE DIPSTICK: NEGATIVE
KETONES UR: NEGATIVE mg/dL
LEUKOCYTES UA: NEGATIVE
Nitrite: NEGATIVE
PH: 6 (ref 5.0–8.0)
PROTEIN: NEGATIVE mg/dL
SPECIFIC GRAVITY, URINE: 1.02 (ref 1.005–1.030)
Urobilinogen, UA: 0.2 mg/dL (ref 0.0–1.0)

## 2016-01-06 NOTE — Patient Instructions (Signed)

## 2016-01-06 NOTE — Progress Notes (Signed)
Subjective:  Meredith Delacruz is a 25 y.o. G2P1001 at [redacted]w[redacted]d being seen today for ongoing prenatal care.  She is currently monitored for the following issues for this low-risk pregnancy and has Supervision of normal pregnancy, antepartum; Trichomonal vaginitis during pregnancy; and GBS (group B streptococcus) UTI complicating pregnancy on her problem list.  Patient reports no complaints.  Contractions: Not present. Vag. Bleeding: None.  Movement: Present. Denies leaking of fluid.   The following portions of the patient's history were reviewed and updated as appropriate: allergies, current medications, past family history, past medical history, past social history, past surgical history and problem list. Problem list updated.  Objective:   Filed Vitals:   01/06/16 0805  BP: 109/68  Pulse: 80  Weight: 160 lb 6.4 oz (72.757 kg)    Fetal Status: Fetal Heart Rate (bpm): 150   Movement: Present     General:  Alert, oriented and cooperative. Patient is in no acute distress.  Skin: Skin is warm and dry. No rash noted.   Cardiovascular: Normal heart rate noted  Respiratory: Normal respiratory effort, no problems with respiration noted  Abdomen: Soft, gravid, appropriate for gestational age. Pain/Pressure: Absent     Pelvic: Vag. Bleeding: None     Cervical exam deferred        Extremities: Normal range of motion.  Edema: None  Mental Status: Normal mood and affect. Normal behavior. Normal judgment and thought content.   Urinalysis:      Assessment and Plan:  Pregnancy: G2P1001 at [redacted]w[redacted]d  1. Supervision of normal pregnancy, antepartum, second trimester Doing well, [place] normal last mo  Preterm labor symptoms and general obstetric precautions including but not limited to vaginal bleeding, contractions, leaking of fluid and fetal movement were reviewed in detail with the patient. Please refer to After Visit Summary for other counseling recommendations.  Return in about 4 weeks (around  02/03/2016).   Woodroe Mode, MD

## 2016-02-03 ENCOUNTER — Ambulatory Visit (INDEPENDENT_AMBULATORY_CARE_PROVIDER_SITE_OTHER): Payer: Medicaid Other | Admitting: Obstetrics and Gynecology

## 2016-02-03 VITALS — BP 105/62 | HR 71 | Wt 159.7 lb

## 2016-02-03 DIAGNOSIS — Z3482 Encounter for supervision of other normal pregnancy, second trimester: Secondary | ICD-10-CM | POA: Diagnosis not present

## 2016-02-03 DIAGNOSIS — Z23 Encounter for immunization: Secondary | ICD-10-CM

## 2016-02-03 DIAGNOSIS — Z3480 Encounter for supervision of other normal pregnancy, unspecified trimester: Secondary | ICD-10-CM

## 2016-02-03 LAB — POCT URINALYSIS DIP (DEVICE)
BILIRUBIN URINE: NEGATIVE
Glucose, UA: NEGATIVE mg/dL
HGB URINE DIPSTICK: NEGATIVE
Ketones, ur: NEGATIVE mg/dL
LEUKOCYTES UA: NEGATIVE
Nitrite: NEGATIVE
PH: 7 (ref 5.0–8.0)
Protein, ur: NEGATIVE mg/dL
SPECIFIC GRAVITY, URINE: 1.015 (ref 1.005–1.030)
UROBILINOGEN UA: 0.2 mg/dL (ref 0.0–1.0)

## 2016-02-03 LAB — CBC
HCT: 32.6 % — ABNORMAL LOW (ref 35.0–45.0)
Hemoglobin: 11 g/dL — ABNORMAL LOW (ref 11.7–15.5)
MCH: 28.3 pg (ref 27.0–33.0)
MCHC: 33.7 g/dL (ref 32.0–36.0)
MCV: 83.8 fL (ref 80.0–100.0)
MPV: 9.5 fL (ref 7.5–12.5)
PLATELETS: 209 10*3/uL (ref 140–400)
RBC: 3.89 MIL/uL (ref 3.80–5.10)
RDW: 13.2 % (ref 11.0–15.0)
WBC: 8.7 10*3/uL (ref 3.8–10.8)

## 2016-02-03 LAB — GLUCOSE TOLERANCE, 1 HOUR (50G) W/O FASTING: GLUCOSE, 1 HR, GESTATIONAL: 147 mg/dL — AB (ref <140)

## 2016-02-03 MED ORDER — TETANUS-DIPHTH-ACELL PERTUSSIS 5-2.5-18.5 LF-MCG/0.5 IM SUSP
0.5000 mL | Freq: Once | INTRAMUSCULAR | Status: AC
  Administered 2016-02-03: 0.5 mL via INTRAMUSCULAR

## 2016-02-03 NOTE — Progress Notes (Signed)
Subjective:  Meredith Delacruz is a 25 y.o. G2P1001 at [redacted]w[redacted]d being seen today for ongoing prenatal care.  She is currently monitored for the following issues for this low-risk pregnancy and has Supervision of normal pregnancy, antepartum; Trichomonal vaginitis during pregnancy; and GBS (group B streptococcus) UTI complicating pregnancy on her problem list.  Patient reports no complaints.  Contractions: Not present. Vag. Bleeding: None.  Movement: Present. Denies leaking of fluid.   The following portions of the patient's history were reviewed and updated as appropriate: allergies, current medications, past family history, past medical history, past social history, past surgical history and problem list. Problem list updated.  Objective:   Filed Vitals:   02/03/16 0806  BP: 105/62  Pulse: 71  Weight: 159 lb 11.2 oz (72.439 kg)    Fetal Status: Fetal Heart Rate (bpm): 140   Movement: Present     General:  Alert, oriented and cooperative. Patient is in no acute distress.  Skin: Skin is warm and dry. No rash noted.   Cardiovascular: Normal heart rate noted  Respiratory: Normal respiratory effort, no problems with respiration noted  Abdomen: Soft, gravid, appropriate for gestational age. Pain/Pressure: Present     Pelvic: Vag. Bleeding: None     Cervical exam deferred        Extremities: Normal range of motion.  Edema: None  Mental Status: Normal mood and affect. Normal behavior. Normal judgment and thought content.   Urinalysis:      Assessment and Plan:  Pregnancy: G2P1001 at [redacted]w[redacted]d  1. Supervision of normal pregnancy, antepartum, unspecified trimester - Tdap (BOOSTRIX) injection 0.5 mL; Inject 0.5 mLs into the muscle once. - Glucose Tolerance, 1 HR (50g) w/o Fasting - CBC - HIV antibody (with reflex) - RPR  Preterm labor symptoms and general obstetric precautions including but not limited to vaginal bleeding, contractions, leaking of fluid and fetal movement were reviewed in detail  with the patient. Please refer to After Visit Summary for other counseling recommendations.  No Follow-up on file.   Gwynne Edinger, MD

## 2016-02-03 NOTE — Progress Notes (Signed)
28 week labs/1hr gtt today Tdap today

## 2016-02-04 ENCOUNTER — Telehealth: Payer: Self-pay | Admitting: *Deleted

## 2016-02-04 LAB — RPR

## 2016-02-04 LAB — HIV ANTIBODY (ROUTINE TESTING W REFLEX): HIV 1&2 Ab, 4th Generation: NONREACTIVE

## 2016-02-04 NOTE — Telephone Encounter (Signed)
-----   Message from Gwynne Edinger, MD sent at 02/04/2016  8:11 AM EDT ----- Failed 1-hour gtt. Please call to inform and to schedule 3- hour. Thanks, Olen Cordial  ----- Message -----    From: [facility]    Sent: 02/03/2016   9:16 PM      To: Gwynne Edinger, MD

## 2016-02-04 NOTE — Telephone Encounter (Signed)
Called patient and discussed gtt result. Understanding voiced. She will come in Fri Morning for 3hr gtt. Will notify front desk for schedule.

## 2016-02-07 ENCOUNTER — Other Ambulatory Visit: Payer: Medicaid Other

## 2016-02-07 DIAGNOSIS — O24419 Gestational diabetes mellitus in pregnancy, unspecified control: Secondary | ICD-10-CM

## 2016-02-08 LAB — GLUCOSE TOLERANCE, 3 HOURS
GLUCOSE, 2 HOUR-GESTATIONAL: 125 mg/dL (ref <165)
Glucose Tolerance, 1 hour: 142 mg/dL (ref <190)
Glucose Tolerance, Fasting: 79 mg/dL (ref 65–104)
Glucose, GTT - 3 Hour: 102 mg/dL (ref <145)

## 2016-02-11 ENCOUNTER — Telehealth: Payer: Self-pay | Admitting: General Practice

## 2016-02-11 NOTE — Telephone Encounter (Signed)
Patient called and left message stating she is calling for her test results. Called patient & informed her of normal 3 hr gtt. Patient verbalized understanding & had no questions

## 2016-02-17 ENCOUNTER — Ambulatory Visit (INDEPENDENT_AMBULATORY_CARE_PROVIDER_SITE_OTHER): Payer: Medicaid Other | Admitting: Obstetrics and Gynecology

## 2016-02-17 VITALS — BP 110/68 | HR 67 | Wt 163.0 lb

## 2016-02-17 DIAGNOSIS — Z3483 Encounter for supervision of other normal pregnancy, third trimester: Secondary | ICD-10-CM

## 2016-02-17 DIAGNOSIS — Z3480 Encounter for supervision of other normal pregnancy, unspecified trimester: Secondary | ICD-10-CM

## 2016-02-17 LAB — POCT URINALYSIS DIP (DEVICE)
Bilirubin Urine: NEGATIVE
Glucose, UA: NEGATIVE mg/dL
HGB URINE DIPSTICK: NEGATIVE
Ketones, ur: NEGATIVE mg/dL
Nitrite: NEGATIVE
PROTEIN: 30 mg/dL — AB
UROBILINOGEN UA: 0.2 mg/dL (ref 0.0–1.0)
pH: 6.5 (ref 5.0–8.0)

## 2016-02-17 NOTE — Progress Notes (Signed)
Urine: sm amt wbcs

## 2016-02-17 NOTE — Progress Notes (Signed)
Subjective:  Meredith Delacruz is a 25 y.o. G2P1001 at [redacted]w[redacted]d being seen today for ongoing prenatal care.  She is currently monitored for the following issues for this low-risk pregnancy and has Supervision of normal pregnancy, antepartum; Trichomonal vaginitis during pregnancy; and GBS (group B streptococcus) UTI complicating pregnancy on her problem list.  Patient reports no complaints.  Contractions: Not present. Vag. Bleeding: None.  Movement: Present. Denies leaking of fluid.   The following portions of the patient's history were reviewed and updated as appropriate: allergies, current medications, past family history, past medical history, past social history, past surgical history and problem list. Problem list updated.  Objective:   Filed Vitals:   02/17/16 0900  BP: 110/68  Pulse: 67  Weight: 163 lb (73.936 kg)    Fetal Status: Fetal Heart Rate (bpm): 132   Movement: Present     General:  Alert, oriented and cooperative. Patient is in no acute distress.  Skin: Skin is warm and dry. No rash noted.   Cardiovascular: Normal heart rate noted  Respiratory: Normal respiratory effort, no problems with respiration noted  Abdomen: Soft, gravid, appropriate for gestational age. Pain/Pressure: Absent     Pelvic: Cervical exam deferred        Extremities: Normal range of motion.  Edema: None  Mental Status: Normal mood and affect. Normal behavior. Normal judgment and thought content.   Urinalysis: Urine Protein: 1+ Urine Glucose: Negative  Assessment and Plan:  Pregnancy: G2P1001 at [redacted]w[redacted]d  # pregnancy - explained need for gbs ppx in labor - f/u 2 wks  There are no diagnoses linked to this encounter. Preterm labor symptoms and general obstetric precautions including but not limited to vaginal bleeding, contractions, leaking of fluid and fetal movement were reviewed in detail with the patient. Please refer to After Visit Summary for other counseling recommendations.   Gwynne Edinger, MD

## 2016-03-09 ENCOUNTER — Other Ambulatory Visit (HOSPITAL_COMMUNITY)
Admission: RE | Admit: 2016-03-09 | Discharge: 2016-03-09 | Disposition: A | Discharge status: Home / Self Care | Payer: Medicaid Other | Source: Ambulatory Visit | Attending: Obstetrics and Gynecology | Admitting: Obstetrics and Gynecology

## 2016-03-09 ENCOUNTER — Ambulatory Visit (INDEPENDENT_AMBULATORY_CARE_PROVIDER_SITE_OTHER): Payer: Medicaid Other | Admitting: Obstetrics and Gynecology

## 2016-03-09 VITALS — BP 108/75 | HR 70 | Wt 162.0 lb

## 2016-03-09 DIAGNOSIS — K219 Gastro-esophageal reflux disease without esophagitis: Secondary | ICD-10-CM | POA: Insufficient documentation

## 2016-03-09 DIAGNOSIS — D126 Benign neoplasm of colon, unspecified: Secondary | ICD-10-CM | POA: Diagnosis present

## 2016-03-09 DIAGNOSIS — Z3490 Encounter for supervision of normal pregnancy, unspecified, unspecified trimester: Secondary | ICD-10-CM

## 2016-03-09 DIAGNOSIS — O26899 Other specified pregnancy related conditions, unspecified trimester: Secondary | ICD-10-CM | POA: Diagnosis present

## 2016-03-09 DIAGNOSIS — N898 Other specified noninflammatory disorders of vagina: Secondary | ICD-10-CM | POA: Diagnosis not present

## 2016-03-09 LAB — POCT URINALYSIS DIP (DEVICE)
Bilirubin Urine: NEGATIVE
GLUCOSE, UA: NEGATIVE mg/dL
Ketones, ur: NEGATIVE mg/dL
NITRITE: NEGATIVE
PROTEIN: NEGATIVE mg/dL
SPECIFIC GRAVITY, URINE: 1.015 (ref 1.005–1.030)
UROBILINOGEN UA: 0.2 mg/dL (ref 0.0–1.0)
pH: 6 (ref 5.0–8.0)

## 2016-03-09 MED ORDER — RANITIDINE HCL 150 MG PO TABS: 150.0000 mg | ORAL_TABLET | Freq: Two times a day (BID) | ORAL | Status: DC

## 2016-03-09 NOTE — Progress Notes (Signed)
Subjective:  Meredith Delacruz is a 25 y.o. G2P1001 at [redacted]w[redacted]d being seen today for ongoing prenatal care.  She is currently monitored for the following issues for this low-risk pregnancy and has Supervision of normal pregnancy, antepartum; Trichomonal vaginitis during pregnancy; and GBS (group B streptococcus) UTI complicating pregnancy on her problem list.  Patient reports increased white milky discharge. No pain. No sex with partner from whom contracted trichomonas. Lots of heart burn past week or so.  Contractions: Irritability. Vag. Bleeding: Scant.  Movement: Present. Denies leaking of fluid.   The following portions of the patient's history were reviewed and updated as appropriate: allergies, current medications, past family history, past medical history, past social history, past surgical history and problem list. Problem list updated.  Objective:   Filed Vitals:   03/09/16 0758  BP: 108/75  Pulse: 70  Weight: 162 lb (73.483 kg)    Fetal Status: Fetal Heart Rate (bpm): 140   Movement: Present     General:  Alert, oriented and cooperative. Patient is in no acute distress.  Skin: Skin is warm and dry. No rash noted.   Cardiovascular: Normal heart rate noted  Respiratory: Normal respiratory effort, no problems with respiration noted  Abdomen: Soft, gravid, appropriate for gestational age. Pain/Pressure: Absent     Pelvic:  Cervical exam deferred        Extremities: Normal range of motion.  Edema: None  Mental Status: Normal mood and affect. Normal behavior. Normal judgment and thought content.   Urinalysis:      Assessment and Plan:  Pregnancy: G2P1001 at [redacted]w[redacted]d  # discharge - wet prep, g, c   # GERD - start ranitidine  There are no diagnoses linked to this encounter. Preterm labor symptoms and general obstetric precautions including but not limited to vaginal bleeding, contractions, leaking of fluid and fetal movement were reviewed in detail with the patient. Please refer to  After Visit Summary for other counseling recommendations.  No Follow-up on file.   Gwynne Edinger, MD

## 2016-03-09 NOTE — Progress Notes (Signed)
Patient reports white/brownish d/c for two weeks now- denies itch or odor but would like it someone to look  Also reports heartburn/diarrhea for past week- no relief

## 2016-03-10 ENCOUNTER — Telehealth: Payer: Self-pay | Admitting: General Practice

## 2016-03-10 LAB — WET PREP, GENITAL
Trich, Wet Prep: NONE SEEN
Yeast Wet Prep HPF POC: NONE SEEN

## 2016-03-10 LAB — GC/CHLAMYDIA PROBE AMP (~~LOC~~) NOT AT ARMC
CHLAMYDIA, DNA PROBE: NEGATIVE
Neisseria Gonorrhea: NEGATIVE

## 2016-03-10 MED ORDER — METRONIDAZOLE 500 MG PO TABS: 500.0000 mg | ORAL_TABLET | Freq: Two times a day (BID) | ORAL | Status: DC

## 2016-03-10 NOTE — Addendum Note (Signed)
Addended by: Laurey Arrow B on: 03/10/2016 03:52 PM   Modules accepted: Orders

## 2016-03-10 NOTE — Telephone Encounter (Signed)
Per Dr Si Raider, patient has BV and flagyl has been sent to pharmacy. Called patient & informed her of BV & medication sent to pharmacy. Patient verbalized understanding & had no questions

## 2016-03-17 ENCOUNTER — Encounter (HOSPITAL_COMMUNITY): Payer: Self-pay | Admitting: Emergency Medicine

## 2016-03-17 ENCOUNTER — Emergency Department (HOSPITAL_COMMUNITY): Payer: Medicaid Other

## 2016-03-17 ENCOUNTER — Emergency Department (HOSPITAL_COMMUNITY)
Admission: EM | Admit: 2016-03-17 | Discharge: 2016-03-17 | Disposition: A | Discharge status: Home / Self Care | Payer: Medicaid Other | Attending: Emergency Medicine | Admitting: Emergency Medicine

## 2016-03-17 DIAGNOSIS — S60417A Abrasion of left little finger, initial encounter: Secondary | ICD-10-CM | POA: Diagnosis not present

## 2016-03-17 DIAGNOSIS — O2340 Unspecified infection of urinary tract in pregnancy, unspecified trimester: Secondary | ICD-10-CM

## 2016-03-17 DIAGNOSIS — Y939 Activity, unspecified: Secondary | ICD-10-CM | POA: Insufficient documentation

## 2016-03-17 DIAGNOSIS — Z3A35 35 weeks gestation of pregnancy: Secondary | ICD-10-CM | POA: Diagnosis not present

## 2016-03-17 DIAGNOSIS — O23599 Infection of other part of genital tract in pregnancy, unspecified trimester: Secondary | ICD-10-CM

## 2016-03-17 DIAGNOSIS — L03114 Cellulitis of left upper limb: Secondary | ICD-10-CM | POA: Diagnosis not present

## 2016-03-17 DIAGNOSIS — Z79899 Other long term (current) drug therapy: Secondary | ICD-10-CM | POA: Insufficient documentation

## 2016-03-17 DIAGNOSIS — S60512A Abrasion of left hand, initial encounter: Secondary | ICD-10-CM | POA: Diagnosis not present

## 2016-03-17 DIAGNOSIS — W010XXA Fall on same level from slipping, tripping and stumbling without subsequent striking against object, initial encounter: Secondary | ICD-10-CM | POA: Diagnosis not present

## 2016-03-17 DIAGNOSIS — Y92480 Sidewalk as the place of occurrence of the external cause: Secondary | ICD-10-CM | POA: Insufficient documentation

## 2016-03-17 DIAGNOSIS — S60419A Abrasion of unspecified finger, initial encounter: Secondary | ICD-10-CM

## 2016-03-17 DIAGNOSIS — L03119 Cellulitis of unspecified part of limb: Secondary | ICD-10-CM

## 2016-03-17 DIAGNOSIS — O26893 Other specified pregnancy related conditions, third trimester: Secondary | ICD-10-CM | POA: Diagnosis not present

## 2016-03-17 DIAGNOSIS — S6992XA Unspecified injury of left wrist, hand and finger(s), initial encounter: Secondary | ICD-10-CM | POA: Diagnosis present

## 2016-03-17 DIAGNOSIS — Z3480 Encounter for supervision of other normal pregnancy, unspecified trimester: Secondary | ICD-10-CM

## 2016-03-17 DIAGNOSIS — Y999 Unspecified external cause status: Secondary | ICD-10-CM | POA: Insufficient documentation

## 2016-03-17 DIAGNOSIS — A5901 Trichomonal vulvovaginitis: Secondary | ICD-10-CM

## 2016-03-17 DIAGNOSIS — B951 Streptococcus, group B, as the cause of diseases classified elsewhere: Secondary | ICD-10-CM

## 2016-03-17 DIAGNOSIS — Z791 Long term (current) use of non-steroidal anti-inflammatories (NSAID): Secondary | ICD-10-CM | POA: Diagnosis not present

## 2016-03-17 MED ORDER — CEPHALEXIN 500 MG PO CAPS
1000.0000 mg | ORAL_CAPSULE | Freq: Once | ORAL | Status: AC
  Administered 2016-03-17: 1000 mg via ORAL
  Filled 2016-03-17: qty 2

## 2016-03-17 MED ORDER — CEPHALEXIN 500 MG PO CAPS: 500.0000 mg | ORAL_CAPSULE | Freq: Four times a day (QID) | ORAL | Status: DC

## 2016-03-17 MED ORDER — BACITRACIN ZINC 500 UNIT/GM EX OINT
TOPICAL_OINTMENT | CUTANEOUS | Status: AC
  Administered 2016-03-17: 1
  Filled 2016-03-17: qty 0.9

## 2016-03-17 NOTE — ED Notes (Signed)
Patient presents for fall on Saturday, [redacted] weeks pregnant, landed on right side, cut left hand, no LOC. Denies abdominal/hip/leg/HA pain, no spotting. Patient has small wound noted to top of left hand with yellow drainage. No obvious deformity, swelling noted. Denies fever.

## 2016-03-17 NOTE — Progress Notes (Signed)
NST completed.  Reactive and reassuring.  MD notified.  Keep scheduled appt with Faculty on 03/23/16.

## 2016-03-17 NOTE — Progress Notes (Signed)
G2P1 at 44 5/7 weeks reports to [facility] with c/o left hand infection.  Mentioned in interview with ER RN that she fell on Saturday onto her right side.  Has had good fetal movement since that incident.  No bleeding or leaking noted. Has an appt with Faculty on 03/23/16. Dr Ihor Dow contacted and updated on pt ER admission and chief complaints.  NST ordered.  Will call MD with results.

## 2016-03-17 NOTE — ED Provider Notes (Addendum)
CSN: [id number]     Arrival date & time 03/17/16  1740 History   First MD Initiated Contact with Patient 03/17/16 1813     Chief Complaint  Patient presents with  . Fall  . Wound Infection     (Consider location/radiation/quality/duration/timing/severity/associated sxs/prior Treatment) Patient is a 25 y.o. female presenting with fall. The history is provided by the patient.  Fall Pertinent negatives include no abdominal pain and no headaches.  Patient indicates 3 days ago tripped on fell on uneven sidewalk, falling onto outstretched hand hand.  Had abrasion/wound to dorsum left hand at base of small finger. Patient removed a small fb/pebble then.  Over course of the past couple days, increased redness and soreness to area.  No purulent drainage. Last tetanus within past 2 years. Denies other pain or injury. Patient is [redacted] weeks pregnant, g2p1, +prenatal care, no complications w pregnancy.  Denies direct trauma or abd/pelvis w fall. Has felt baby move normally since fall. No vaginal bleeding/fluid. No contractions.       History reviewed. No pertinent past medical history. History reviewed. No pertinent past surgical history. No family history on file. Social History  Substance Use Topics  . Smoking status: Never Smoker   . Smokeless tobacco: Never Used  . Alcohol Use: Yes     Comment: socially before pregnancy   OB History    Gravida Para Term Preterm AB TAB SAB Ectopic Multiple Living   2 1 1       1      Review of Systems  Constitutional: Negative for fever, chills and diaphoresis.  Gastrointestinal: Negative for abdominal pain.  Genitourinary: Negative for vaginal bleeding and pelvic pain.  Musculoskeletal: Negative for back pain and neck pain.  Skin: Positive for wound.  Neurological: Negative for headaches.      Allergies  Review of patient's allergies indicates no known allergies.  Home Medications   Prior to Admission medications   Medication Sig Start Date  End Date Taking? Authorizing Provider  flintstones complete (FLINTSTONES) 60 MG chewable tablet Chew 2 tablets by mouth daily.   Yes Historical Provider, MD  metroNIDAZOLE (FLAGYL) 500 MG tablet Take 1 tablet (500 mg total) by mouth 2 (two) times daily. 03/10/16  Yes Gwynne Edinger, MD  promethazine (PHENERGAN) 25 MG tablet TK 1 T PO  Q 6 H PRF NAUSEA OR VOMITING 03/09/16  Yes Historical Provider, MD  ranitidine (ZANTAC) 150 MG tablet Take 1 tablet (150 mg total) by mouth 2 (two) times daily. 03/09/16  Yes Gwynne Edinger, MD  acetaminophen (TYLENOL) 500 MG tablet Take 1,000 mg by mouth every 6 (six) hours as needed for mild pain, moderate pain or fever. Reported on 02/17/2016    Historical Provider, MD  calcium carbonate (TUMS - DOSED IN MG ELEMENTAL CALCIUM) 500 MG chewable tablet Chew 1 tablet by mouth as needed for indigestion or heartburn.    Historical Provider, MD   LMP 07/15/2015 Physical Exam  Constitutional: She appears well-developed and well-nourished. No distress.  HENT:  Head: Atraumatic.  Eyes: No scleral icterus.  Neck: Neck supple. No tracheal deviation present.  Cardiovascular: Intact distal pulses.   Pulmonary/Chest: Effort normal. No respiratory distress.  Abdominal: Soft. Normal appearance. There is no tenderness.  Musculoskeletal:  Mild sts and erythema dorsum left hand around recent wound, c/w cellulitis. No fb seen or felt. No pain w rom 4th and 5th digits, no tenosynovitis.   Tenderness to area. No wrist/scaphoid tenderness. Normal cap refill distally.  Neurological: She is alert.  Able to move all L fingers normally. sens grossly intact.    Skin: Skin is warm and dry. No rash noted. She is not diaphoretic.  Psychiatric: She has a normal mood and affect.  Nursing note and vitals reviewed.   ED Course  Procedures (including critical care time)   Dg Hand Complete Left  03/17/2016  CLINICAL DATA:  25 year old female status post fall 3 days ago with left hand  laceration and pain. Initial encounter. Query foreign body. Thirty-five weeks pregnant. EXAM: LEFT HAND - COMPLETE 3+ VIEW COMPARISON:  None. FINDINGS: The patient was double shielded for the exam. Bone mineralization is within normal limits. Distal radius and ulna intact. Carpal bone alignment and joint spaces within normal limits. Metacarpals intact. Phalanges intact. No radiopaque foreign body identified. No subcutaneous gas identified. IMPRESSION: Negative radiographic appearance of the left hand. No radiopaque foreign body identified. Electronically Signed   By: Genevie Ann M.D.   On: 03/17/2016 19:32      I have personally reviewed and evaluated these images as part of my medical decision-making.    MDM   Wound cleaned, bacitracin and sterile dressing.  Patient reports removing small fb from wound at time of fall, will xr to r/o fb or fx.  Reviewed nursing notes and prior charts for additional history.       xrays neg acute.  Keflex 1 gm po. rx for home.  [facility] r[facility] has evaluated - recommendations are to d/c and keep currently scheduled outpt appt:  Patient Name Sex DOB SSN   Meredith Delacruz, Meredith Delacruz Female [birth year] [id number]    Progress Notes by Dawson Bills, RN at 03/17/2016 6:57 PM    Author: Dawson Bills, RN Service: Obstetrics/Gynecology Author Type: Registered Nurse   Filed: 03/17/2016 6:57 PM Note Time: 03/17/2016 6:57 PM Status: Signed   Editor: Dawson Bills, RN (Registered Nurse)     Expand All Collapse All   NST completed. Reactive and reassuring. MD notified. Keep scheduled appt with Faculty on 03/23/16.       Patient currently appears stable for d/c.      Lajean Saver, MD 03/17/16 2020

## 2016-03-17 NOTE — ED Notes (Signed)
[facility] called. States she will called patient's OBGYN and called back about POC.

## 2016-03-17 NOTE — ED Notes (Signed)
Pt left before receiving discharge instructions. Pt called to come back so that she could receive prescription for antibiotics. Pt states she will come back in order to receive prescription.

## 2016-03-17 NOTE — Discharge Instructions (Signed)
It was our pleasure to provide your ER care today - we hope that you feel better.  Keep area very clean.  Wash with warm water and soap 2x/day.    You may apply a thin coat of bacitracin to area for the next few days.  Take antibiotic (keflex) as prescribed.  Take tylenol as need.  Return to ER if worse, spreading redness, severe pain, high fevers, or other concern.    Wound Care Taking care of your wound properly can help to prevent pain and infection. It can also help your wound to heal more quickly.  HOW TO CARE FOR YOUR WOUND  Take or apply over-the-counter and prescription medicines only as told by your health care provider.  If you were prescribed antibiotic medicine, take or apply it as told by your health care provider. Do not stop using the antibiotic even if your condition improves.  Clean the wound each day or as told by your health care provider.  Wash the wound with mild soap and water.  Rinse the wound with water to remove all soap.  Pat the wound dry with a clean towel. Do not rub it.  There are many different ways to close and cover a wound. For example, a wound can be covered with stitches (sutures), skin glue, or adhesive strips. Follow instructions from your health care provider about:  How to take care of your wound.  When and how you should change your bandage (dressing).  When you should remove your dressing.  Removing whatever was used to close your wound.  Check your wound every day for signs of infection. Watch for:  Redness, swelling, or pain.  Fluid, blood, or pus.  Keep the dressing dry until your health care provider says it can be removed. Do not take baths, swim, use a hot tub, or do anything that would put your wound underwater until your health care provider approves.  Raise (elevate) the injured area above the level of your heart while you are sitting or lying down.  Do not scratch or pick at the wound.  Keep all follow-up visits  as told by your health care provider. This is important. SEEK MEDICAL CARE IF:  You received a tetanus shot and you have swelling, severe pain, redness, or bleeding at the injection site.  You have a fever.  Your pain is not controlled with medicine.  You have increased redness, swelling, or pain at the site of your wound.  You have fluid, blood, or pus coming from your wound.  You notice a bad smell coming from your wound or your dressing. SEEK IMMEDIATE MEDICAL CARE IF:  You have a red streak going away from your wound.   This information is not intended to replace advice given to you by your health care provider. Make sure you discuss any questions you have with your health care provider.   Document Released: 05/26/2008 Document Revised: 01/01/2015 Document Reviewed: 08/13/2014 Elsevier Interactive Patient Education 2016 Elsevier Inc[employer]   Cellulitis Cellulitis is an infection of the skin and the tissue beneath it. The infected area is usually red and tender. Cellulitis occurs most often in the arms and lower legs.  CAUSES  Cellulitis is caused by bacteria that enter the skin through cracks or cuts in the skin. The most common types of bacteria that cause cellulitis are staphylococci and streptococci. SIGNS AND SYMPTOMS   Redness and warmth.  Swelling.  Tenderness or pain.  Fever. DIAGNOSIS  Your health care provider  can usually determine what is wrong based on a physical exam. Blood tests may also be done. TREATMENT  Treatment usually involves taking an antibiotic medicine. HOME CARE INSTRUCTIONS   Take your antibiotic medicine as directed by your health care provider. Finish the antibiotic even if you start to feel better.  Keep the infected arm or leg elevated to reduce swelling.  Apply a warm cloth to the affected area up to 4 times per day to relieve pain.  Take medicines only as directed by your health care provider.  Keep all follow-up visits as directed by  your health care provider. SEEK MEDICAL CARE IF:   You notice red streaks coming from the infected area.  Your red area gets larger or turns dark in color.  Your bone or joint underneath the infected area becomes painful after the skin has healed.  Your infection returns in the same area or another area.  You notice a swollen bump in the infected area.  You develop new symptoms.  You have a fever. SEEK IMMEDIATE MEDICAL CARE IF:   You feel very sleepy.  You develop vomiting or diarrhea.  You have a general ill feeling (malaise) with muscle aches and pains.   This information is not intended to replace advice given to you by your health care provider. Make sure you discuss any questions you have with your health care provider.   Document Released: 05/27/2005 Document Revised: 05/08/2015 Document Reviewed: 11/02/2011 Elsevier Interactive Patient Education [employer].

## 2016-03-23 ENCOUNTER — Encounter: Payer: Medicaid Other | Admitting: Obstetrics & Gynecology

## 2016-03-25 ENCOUNTER — Inpatient Hospital Stay (HOSPITAL_COMMUNITY)
Admission: AD | Admit: 2016-03-25 | Discharge: 2016-03-25 | Disposition: A | Discharge status: Home / Self Care | Payer: Medicaid Other | Source: Ambulatory Visit | Attending: Obstetrics & Gynecology | Admitting: Obstetrics & Gynecology

## 2016-03-25 ENCOUNTER — Encounter (HOSPITAL_COMMUNITY): Payer: Self-pay | Admitting: *Deleted

## 2016-03-25 DIAGNOSIS — R109 Unspecified abdominal pain: Secondary | ICD-10-CM | POA: Insufficient documentation

## 2016-03-25 DIAGNOSIS — Z3A34 34 weeks gestation of pregnancy: Secondary | ICD-10-CM | POA: Insufficient documentation

## 2016-03-25 DIAGNOSIS — O2340 Unspecified infection of urinary tract in pregnancy, unspecified trimester: Secondary | ICD-10-CM

## 2016-03-25 DIAGNOSIS — O26893 Other specified pregnancy related conditions, third trimester: Secondary | ICD-10-CM | POA: Insufficient documentation

## 2016-03-25 DIAGNOSIS — O4703 False labor before 37 completed weeks of gestation, third trimester: Secondary | ICD-10-CM | POA: Diagnosis not present

## 2016-03-25 DIAGNOSIS — Z3689 Encounter for other specified antenatal screening: Secondary | ICD-10-CM

## 2016-03-25 DIAGNOSIS — O23599 Infection of other part of genital tract in pregnancy, unspecified trimester: Secondary | ICD-10-CM

## 2016-03-25 DIAGNOSIS — M549 Dorsalgia, unspecified: Secondary | ICD-10-CM

## 2016-03-25 DIAGNOSIS — Z79899 Other long term (current) drug therapy: Secondary | ICD-10-CM | POA: Insufficient documentation

## 2016-03-25 DIAGNOSIS — B951 Streptococcus, group B, as the cause of diseases classified elsewhere: Secondary | ICD-10-CM

## 2016-03-25 DIAGNOSIS — O9989 Other specified diseases and conditions complicating pregnancy, childbirth and the puerperium: Secondary | ICD-10-CM

## 2016-03-25 DIAGNOSIS — Z3480 Encounter for supervision of other normal pregnancy, unspecified trimester: Secondary | ICD-10-CM

## 2016-03-25 DIAGNOSIS — A5901 Trichomonal vulvovaginitis: Secondary | ICD-10-CM

## 2016-03-25 LAB — URINE MICROSCOPIC-ADD ON

## 2016-03-25 LAB — URINALYSIS, ROUTINE W REFLEX MICROSCOPIC
Bilirubin Urine: NEGATIVE
Glucose, UA: NEGATIVE mg/dL
Hgb urine dipstick: NEGATIVE
KETONES UR: NEGATIVE mg/dL
NITRITE: NEGATIVE
PROTEIN: NEGATIVE mg/dL
Specific Gravity, Urine: 1.015 (ref 1.005–1.030)
pH: 5.5 (ref 5.0–8.0)

## 2016-03-25 MED ORDER — CYCLOBENZAPRINE HCL 10 MG PO TABS: 10.0000 mg | ORAL_TABLET | Freq: Three times a day (TID) | ORAL | 0 refills | Status: DC | PRN

## 2016-03-25 NOTE — MAU Note (Signed)
PT  SAYS  AT 8 PM  SHE FELT  LIGHT UC.   AT 12 MN - UC  HURT.   NOW  BACK HURTS.   [employer]- IN CLINIC ,   NO VE.  NO HSV AND MRSA.  BABY MOVING.

## 2016-03-25 NOTE — Discharge Instructions (Signed)

## 2016-03-25 NOTE — MAU Provider Note (Signed)
MAU HISTORY AND PHYSICAL  Chief Complaint:  Abdominal Cramping   Meredith Delacruz is a 25 y.o.  G2P1001  at [redacted]w[redacted]d presenting for Abdominal Cramping . Patient states she has been having  irregular, every >20 minutes contractions, none vaginal bleeding, intact membranes, with active fetal movement.  Pt complains of back pain that is diffuse and unchanged by a bath. Denies urinary symptoms.  History reviewed. No pertinent past medical history.  History reviewed. No pertinent surgical history.  History reviewed. No pertinent family history.  Social History  Substance Use Topics  . Smoking status: Never Smoker  . Smokeless tobacco: Never Used  . Alcohol use Yes     Comment: socially before pregnancy    No Known Allergies  Prescriptions Prior to Admission  Medication Sig Dispense Refill Last Dose  . cephALEXin (KEFLEX) 500 MG capsule Take 1 capsule (500 mg total) by mouth 4 (four) times daily. 28 capsule 0 Past Week at Unknown time  . flintstones complete (FLINTSTONES) 60 MG chewable tablet Chew 2 tablets by mouth daily.   03/24/2016 at Unknown time  . ranitidine (ZANTAC) 150 MG tablet Take 1 tablet (150 mg total) by mouth 2 (two) times daily. 60 tablet 3 Past Week at Unknown time  . acetaminophen (TYLENOL) 500 MG tablet Take 1,000 mg by mouth every 6 (six) hours as needed for mild pain, moderate pain or fever. Reported on 02/17/2016   unknown  . calcium carbonate (TUMS - DOSED IN MG ELEMENTAL CALCIUM) 500 MG chewable tablet Chew 1 tablet by mouth as needed for indigestion or heartburn.   unknown  . metroNIDAZOLE (FLAGYL) 500 MG tablet Take 1 tablet (500 mg total) by mouth 2 (two) times daily. 14 tablet 0 03/17/2016 at Unknown time  . promethazine (PHENERGAN) 25 MG tablet TK 1 T PO  Q 6 H PRF NAUSEA OR VOMITING  2 Past Month at Unknown time    Review of Systems - Negative except for what is mentioned in HPI.  Physical Exam  Blood pressure 123/86, pulse 62, temperature 97.3 F (36.3 C),  temperature source Oral, resp. rate 18, height 5\' 2"  (1.575 m), weight 76.1 kg (167 lb 12 oz), last menstrual period 07/15/2015. GENERAL: Well-developed, well-nourished female in no acute distress.  LUNGS: Clear to auscultation bilaterally.  HEART: Regular rate and rhythm. ABDOMEN: Soft, nontender, nondistended, gravid. No CVA tenderness. Diffuse MSK tenderness over mid back. No pain to palpation over spinous processes. EXTREMITIES: Nontender, no edema, 2+ distal pulses. Presentation: cephalic SVE: FT/th/out of pelvis FHT:  Cat 1 Contractions: >20 min apart   Labs: Results for orders placed or performed during the hospital encounter of 03/25/16 (from the past 24 hour(s))  Urinalysis, Routine w reflex microscopic (not at [facility])   Collection Time: 03/25/16  4:05 AM  Result Value Ref Range   Color, Urine YELLOW YELLOW   APPearance CLEAR CLEAR   Specific Gravity, Urine 1.015 1.005 - 1.030   pH 5.5 5.0 - 8.0   Glucose, UA NEGATIVE NEGATIVE mg/dL   Hgb urine dipstick NEGATIVE NEGATIVE   Bilirubin Urine NEGATIVE NEGATIVE   Ketones, ur NEGATIVE NEGATIVE mg/dL   Protein, ur NEGATIVE NEGATIVE mg/dL   Nitrite NEGATIVE NEGATIVE   Leukocytes, UA SMALL (A) NEGATIVE  Urine microscopic-add on   Collection Time: 03/25/16  4:05 AM  Result Value Ref Range   Squamous Epithelial / LPF 0-5 (A) NONE SEEN   WBC, UA 0-5 0 - 5 WBC/hpf   RBC / HPF 0-5 0 - 5 RBC/hpf  Bacteria, UA FEW (A) NONE SEEN    Assessment: Meredith Delacruz is  25 y.o. G2P1001 at [redacted]w[redacted]d presents with Abdominal Cramping .  Plan: #labor check - cervix fingertip, thick, posterior; no regular contractions; urine WNL #back pain - no CVA tenderness, diffuse MSK tenderness; rx flexeril   Meredith Delacruz 7/26/20174:57 AM   Midwife attestation:  I have seen and examined this patient; I agree with above documentation in the resident's note.   Meredith Delacruz is a 25 y.o. G2P1001 reporting ctx and back pain +FM, denies LOF, VB,  contractions, vaginal discharge.  PE: BP 123/86 (BP Location: Left Arm)   Pulse 62   Temp 97.3 F (36.3 C) (Oral)   Resp 18   Ht 5\' 2"  (1.575 m)   Wt 76.1 kg (167 lb 12 oz)   LMP 07/15/2015   BMI 30.68 kg/m  Gen: calm comfortable, NAD Resp: normal effort, no distress Abd: gravid  ROS, labs, PMH reviewed NST reactive  A/P: No evidence of UTI or PTL. Stable for discharge home. Flexeril Rx. Tylenol and heating pad prn.  PTL precautions. Follow up in [place] next week as scheduled  Meredith Delacruz, CNM  5:11 AM

## 2016-04-06 ENCOUNTER — Ambulatory Visit (INDEPENDENT_AMBULATORY_CARE_PROVIDER_SITE_OTHER): Payer: Medicaid Other | Admitting: Obstetrics & Gynecology

## 2016-04-06 ENCOUNTER — Other Ambulatory Visit (HOSPITAL_COMMUNITY)
Admission: RE | Admit: 2016-04-06 | Discharge: 2016-04-06 | Disposition: A | Discharge status: Home / Self Care | Payer: Medicaid Other | Source: Ambulatory Visit | Attending: Obstetrics & Gynecology | Admitting: Obstetrics & Gynecology

## 2016-04-06 VITALS — BP 117/90 | HR 61 | Wt 168.7 lb

## 2016-04-06 DIAGNOSIS — O2341 Unspecified infection of urinary tract in pregnancy, first trimester: Secondary | ICD-10-CM | POA: Diagnosis not present

## 2016-04-06 DIAGNOSIS — O368131 Decreased fetal movements, third trimester, fetus 1: Secondary | ICD-10-CM

## 2016-04-06 DIAGNOSIS — O368130 Decreased fetal movements, third trimester, not applicable or unspecified: Secondary | ICD-10-CM | POA: Diagnosis not present

## 2016-04-06 DIAGNOSIS — Z113 Encounter for screening for infections with a predominantly sexual mode of transmission: Secondary | ICD-10-CM | POA: Insufficient documentation

## 2016-04-06 DIAGNOSIS — Z3483 Encounter for supervision of other normal pregnancy, third trimester: Secondary | ICD-10-CM

## 2016-04-06 DIAGNOSIS — O2340 Unspecified infection of urinary tract in pregnancy, unspecified trimester: Principal | ICD-10-CM

## 2016-04-06 DIAGNOSIS — B951 Streptococcus, group B, as the cause of diseases classified elsewhere: Secondary | ICD-10-CM

## 2016-04-06 LAB — POCT URINALYSIS DIP (DEVICE)
GLUCOSE, UA: NEGATIVE mg/dL
HGB URINE DIPSTICK: NEGATIVE
KETONES UR: NEGATIVE mg/dL
LEUKOCYTES UA: NEGATIVE
NITRITE: NEGATIVE
PH: 6.5 (ref 5.0–8.0)
PROTEIN: 30 mg/dL — AB
Specific Gravity, Urine: 1.025 (ref 1.005–1.030)
Urobilinogen, UA: 1 mg/dL (ref 0.0–1.0)

## 2016-04-06 NOTE — Progress Notes (Signed)
Subjective:  Meredith Delacruz is a 25 y.o. G2P1001 at [redacted]w[redacted]d being seen today for ongoing prenatal care.  She is currently monitored for the following issues for this low-risk pregnancy and has Supervision of normal pregnancy, antepartum; Trichomonal vaginitis during pregnancy; GBS (group B streptococcus) UTI complicating pregnancy; and GERD (gastroesophageal reflux disease) on her problem list.  Patient reports decreased FM for the last 4 days, feels "hiccup- like movements but not real movements".  Contractions: Irritability. Vag. Bleeding: None.  Movement: (!) Decreased. Denies leaking of fluid.   The following portions of the patient's history were reviewed and updated as appropriate: allergies, current medications, past family history, past medical history, past social history, past surgical history and problem list. Problem list updated.  Objective:   Vitals:   04/06/16 1101  BP: 117/90  Pulse: 61  Weight: 168 lb 11.2 oz (76.5 kg)    Fetal Status: Fetal Heart Rate (bpm): 143   Movement: (!) Decreased     General:  Alert, oriented and cooperative. Patient is in no acute distress.  Skin: Skin is warm and dry. No rash noted.   Cardiovascular: Normal heart rate noted  Respiratory: Normal respiratory effort, no problems with respiration noted  Abdomen: Soft, gravid, appropriate for gestational age. Pain/Pressure: Present     Pelvic:  Cervical exam deferred        Extremities: Normal range of motion.  Edema: Trace  Mental Status: Normal mood and affect. Normal behavior. Normal judgment and thought content.   Urinalysis:      Assessment and Plan:  Pregnancy: G2P1001 at [redacted]w[redacted]d  1. GBS (group B streptococcus) UTI complicating pregnancy, unspecified trimester   2. Supervision of normal pregnancy, antepartum, third trimester - Cervical cultures (+ GBS)  Preterm labor symptoms and general obstetric precautions including but not limited to vaginal bleeding, contractions, leaking of fluid  and fetal movement were reviewed in detail with the patient. Please refer to After Visit Summary for other counseling recommendations.  No Follow-up on file.   Emily Filbert, MD

## 2016-04-07 LAB — GC/CHLAMYDIA PROBE AMP (~~LOC~~) NOT AT ARMC
CHLAMYDIA, DNA PROBE: NEGATIVE
NEISSERIA GONORRHEA: NEGATIVE

## 2016-04-12 ENCOUNTER — Inpatient Hospital Stay (HOSPITAL_COMMUNITY)
Admission: AD | Admit: 2016-04-12 | Discharge: 2016-04-14 | DRG: 775 | Disposition: A | Discharge status: Home / Self Care | Payer: Medicaid Other | Source: Ambulatory Visit | Attending: Obstetrics & Gynecology | Admitting: Obstetrics & Gynecology

## 2016-04-12 ENCOUNTER — Encounter (HOSPITAL_COMMUNITY): Payer: Self-pay | Admitting: *Deleted

## 2016-04-12 DIAGNOSIS — Z880 Allergy status to penicillin: Secondary | ICD-10-CM | POA: Diagnosis not present

## 2016-04-12 DIAGNOSIS — Z3483 Encounter for supervision of other normal pregnancy, third trimester: Secondary | ICD-10-CM | POA: Diagnosis present

## 2016-04-12 DIAGNOSIS — O23599 Infection of other part of genital tract in pregnancy, unspecified trimester: Secondary | ICD-10-CM

## 2016-04-12 DIAGNOSIS — B951 Streptococcus, group B, as the cause of diseases classified elsewhere: Secondary | ICD-10-CM

## 2016-04-12 DIAGNOSIS — IMO0001 Reserved for inherently not codable concepts without codable children: Secondary | ICD-10-CM

## 2016-04-12 DIAGNOSIS — O99824 Streptococcus B carrier state complicating childbirth: Secondary | ICD-10-CM | POA: Diagnosis present

## 2016-04-12 DIAGNOSIS — Z3A37 37 weeks gestation of pregnancy: Secondary | ICD-10-CM

## 2016-04-12 DIAGNOSIS — O9962 Diseases of the digestive system complicating childbirth: Secondary | ICD-10-CM | POA: Diagnosis present

## 2016-04-12 DIAGNOSIS — K219 Gastro-esophageal reflux disease without esophagitis: Secondary | ICD-10-CM | POA: Diagnosis present

## 2016-04-12 DIAGNOSIS — O2340 Unspecified infection of urinary tract in pregnancy, unspecified trimester: Secondary | ICD-10-CM

## 2016-04-12 DIAGNOSIS — A5901 Trichomonal vulvovaginitis: Secondary | ICD-10-CM

## 2016-04-12 DIAGNOSIS — Z8759 Personal history of other complications of pregnancy, childbirth and the puerperium: Secondary | ICD-10-CM

## 2016-04-12 HISTORY — DX: Trichomoniasis, unspecified: A59.9

## 2016-04-12 HISTORY — DX: Gastro-esophageal reflux disease without esophagitis: K21.9

## 2016-04-12 HISTORY — DX: Unspecified infectious disease: B99.9

## 2016-04-12 LAB — TYPE AND SCREEN
ABO/RH(D): A POS
Antibody Screen: NEGATIVE

## 2016-04-12 LAB — CBC
HCT: 33.7 % — ABNORMAL LOW (ref 36.0–46.0)
Hemoglobin: 11.2 g/dL — ABNORMAL LOW (ref 12.0–15.0)
MCH: 25.3 pg — AB (ref 26.0–34.0)
MCHC: 33.2 g/dL (ref 30.0–36.0)
MCV: 76.1 fL — AB (ref 78.0–100.0)
PLATELETS: 210 10*3/uL (ref 150–400)
RBC: 4.43 MIL/uL (ref 3.87–5.11)
RDW: 14.6 % (ref 11.5–15.5)
WBC: 10.2 10*3/uL (ref 4.0–10.5)

## 2016-04-12 LAB — ABO/RH: ABO/RH(D): A POS

## 2016-04-12 MED ORDER — WITCH HAZEL-GLYCERIN EX PADS: 1.0000 "application " | MEDICATED_PAD | CUTANEOUS | Status: DC | PRN

## 2016-04-12 MED ORDER — PENICILLIN G POTASSIUM 5000000 UNITS IJ SOLR
2.5000 10*6.[IU] | INTRAVENOUS | Status: DC
  Filled 2016-04-12 (×3): qty 2.5

## 2016-04-12 MED ORDER — DIPHENHYDRAMINE HCL 25 MG PO CAPS: 25.0000 mg | ORAL_CAPSULE | Freq: Four times a day (QID) | ORAL | Status: DC | PRN

## 2016-04-12 MED ORDER — OXYCODONE-ACETAMINOPHEN 5-325 MG PO TABS
2.0000 | ORAL_TABLET | ORAL | Status: DC | PRN
Start: 2016-04-12 — End: 2016-04-14

## 2016-04-12 MED ORDER — COCONUT OIL OIL: 1.0000 "application " | TOPICAL_OIL | Status: DC | PRN

## 2016-04-12 MED ORDER — ONDANSETRON HCL 4 MG PO TABS: 4.0000 mg | ORAL_TABLET | ORAL | Status: DC | PRN

## 2016-04-12 MED ORDER — DIBUCAINE 1 % RE OINT: 1.0000 "application " | TOPICAL_OINTMENT | RECTAL | Status: DC | PRN

## 2016-04-12 MED ORDER — ZOLPIDEM TARTRATE 5 MG PO TABS: 5.0000 mg | ORAL_TABLET | Freq: Every evening | ORAL | Status: DC | PRN

## 2016-04-12 MED ORDER — PRENATAL MULTIVITAMIN CH
1.0000 | ORAL_TABLET | Freq: Every day | ORAL | Status: DC
  Administered 2016-04-13 – 2016-04-14 (×2): 1 via ORAL
  Filled 2016-04-12 (×2): qty 1

## 2016-04-12 MED ORDER — IBUPROFEN 600 MG PO TABS
600.0000 mg | ORAL_TABLET | Freq: Four times a day (QID) | ORAL | Status: DC
  Administered 2016-04-12 – 2016-04-14 (×8): 600 mg via ORAL
  Filled 2016-04-12 (×8): qty 1

## 2016-04-12 MED ORDER — MEASLES, MUMPS & RUBELLA VAC ~~LOC~~ INJ
0.5000 mL | INJECTION | Freq: Once | SUBCUTANEOUS | Status: DC
  Filled 2016-04-12: qty 0.5

## 2016-04-12 MED ORDER — FERROUS SULFATE 325 (65 FE) MG PO TABS
325.0000 mg | ORAL_TABLET | Freq: Two times a day (BID) | ORAL | Status: DC
  Administered 2016-04-13 – 2016-04-14 (×3): 325 mg via ORAL
  Filled 2016-04-12 (×3): qty 1

## 2016-04-12 MED ORDER — ACETAMINOPHEN 325 MG PO TABS
650.0000 mg | ORAL_TABLET | ORAL | Status: DC | PRN
  Administered 2016-04-12 – 2016-04-14 (×2): 650 mg via ORAL
  Filled 2016-04-12 (×2): qty 2

## 2016-04-12 MED ORDER — PENICILLIN G POTASSIUM 5000000 UNITS IJ SOLR
5.0000 10*6.[IU] | Freq: Once | INTRAVENOUS | Status: AC
  Administered 2016-04-12: 5 10*6.[IU] via INTRAVENOUS
  Filled 2016-04-12 (×2): qty 5

## 2016-04-12 MED ORDER — LACTATED RINGERS IV SOLN
INTRAVENOUS | Status: DC
  Administered 2016-04-12: 13:00:00 via INTRAVENOUS

## 2016-04-12 MED ORDER — MAGNESIUM HYDROXIDE 400 MG/5ML PO SUSP: 30.0000 mL | ORAL | Status: DC | PRN

## 2016-04-12 MED ORDER — LACTATED RINGERS IV BOLUS (SEPSIS)
1000.0000 mL | Freq: Once | INTRAVENOUS | Status: AC
  Administered 2016-04-12: 1000 mL via INTRAVENOUS

## 2016-04-12 MED ORDER — BENZOCAINE-MENTHOL 20-0.5 % EX AERO: 1.0000 "application " | INHALATION_SPRAY | CUTANEOUS | Status: DC | PRN

## 2016-04-12 MED ORDER — OXYCODONE-ACETAMINOPHEN 5-325 MG PO TABS
1.0000 | ORAL_TABLET | ORAL | Status: DC | PRN
  Administered 2016-04-12: 1 via ORAL
  Filled 2016-04-12: qty 1

## 2016-04-12 MED ORDER — ONDANSETRON HCL 4 MG/2ML IJ SOLN: 4.0000 mg | INTRAMUSCULAR | Status: DC | PRN

## 2016-04-12 MED ORDER — FENTANYL CITRATE (PF) 100 MCG/2ML IJ SOLN
100.0000 ug | Freq: Once | INTRAMUSCULAR | Status: AC
  Administered 2016-04-12: 100 ug via INTRAVENOUS
  Filled 2016-04-12: qty 2

## 2016-04-12 MED ORDER — SIMETHICONE 80 MG PO CHEW: 80.0000 mg | CHEWABLE_TABLET | ORAL | Status: DC | PRN

## 2016-04-12 MED ORDER — TETANUS-DIPHTH-ACELL PERTUSSIS 5-2.5-18.5 LF-MCG/0.5 IM SUSP: 0.5000 mL | Freq: Once | INTRAMUSCULAR | Status: DC

## 2016-04-12 NOTE — H&P (Signed)
HPI: Meredith Delacruz is a 25 y.o. year old G78P1001 female at [redacted]w[redacted]d weeks gestation who presents to [facility]U reporting Labor. Changed from 3-5 cm in [facility]U. Having few variables.    Clinic  WOC  Prenatal Labs  Dating  6 wk ultrasound Blood type:   A pos  Genetic Screen 1 Screen: NT wnl, labs normal   Antibody: neg  Anatomic US  wnl save for EIF; female Rubella:  immune  GTT Early:               Third trimester:147   3hr normal. RPR:   NR  Flu vaccine declined HBsAg:   neg  TDaP vaccine   02-03-16                                            Rhogam: NA HIV:   NR  Baby Food  bot GBS: Pos (For PCN allergy, check sensitivities)  Contraception  Nexplanon Pap: wnl 10/2015  Circumcision  n/a   Pediatrician    Support Person     Patient Active Problem List   Diagnosis Date Noted  . GERD (gastroesophageal reflux disease) 03/09/2016  . GBS (group B streptococcus) UTI complicating pregnancy [id number]  . Supervision of normal pregnancy, antepartum 10/08/2015  . Trichomonal vaginitis during pregnancy 10/08/2015     OB History    Gravida Para Term Preterm AB Living   2 1 1     1    SAB TAB Ectopic Multiple Live Births           1     Past Medical History:  Diagnosis Date  . GERD (gastroesophageal reflux disease)   . Infection    UTI  . Trichomonal infection    Past Surgical History:  Procedure Laterality Date  . NO PAST SURGERIES     Family History: family history is not on file. Social History:  reports that she has never smoked. She has never used smokeless tobacco. She reports that she drinks alcohol. She reports that she does not use drugs.     Maternal Diabetes: No Genetic Screening: Normal Maternal Ultrasounds/Referrals: Abnormal:  Findings:   Isolated EIF (echogenic intracardiac focus) Fetal Ultrasounds or other Referrals:  None Maternal Substance Abuse:  No Significant Maternal Medications:  None Significant Maternal Lab Results:  Lab values include: Group B Strep positive, Other:   Other Comments:  Trich pos in pregnancy. TOC neg.   Review of Systems  Constitutional: Negative for chills and fever.  Eyes: Negative for blurred vision.  Gastrointestinal: Positive for abdominal pain.  Genitourinary:       Neg for LOF, VB  Neurological: Negative for headaches.   History Dilation: 5 Effacement (%): 100 Station: -1 Exam by:: [place] Jessup Ogas CNM Blood pressure 118/79, pulse 68, temperature 98.1 F (36.7 C), temperature source Oral, resp. rate 16, weight 169 lb (76.7 kg), last menstrual period 07/15/2015. Maternal Exam:  Uterine Assessment: Contraction strength is firm.  Contraction frequency is regular.   Abdomen: Patient reports no abdominal tenderness. Estimated fetal weight is 6 lb.   Fetal presentation: vertex  Introitus: Normal vulva. Vulva is negative for lesion.  Normal vagina.  Pelvis: adequate for delivery.   Cervix: Cervix evaluated by digital exam.     Fetal Exam Fetal Monitor Review: Mode: ultrasound.   Baseline rate: 140.  Variability: moderate (6-25 bpm).   Pattern: accelerations present and variable  decelerations.    Fetal State Assessment: Category II - tracings are indeterminate.     Physical Exam  Nursing note and vitals reviewed. Constitutional: She is oriented to person, place, and time. She appears well-developed and well-nourished. She appears distressed.  HENT:  Head: Normocephalic.  Eyes: Conjunctivae are normal.  Cardiovascular: Normal rate.   Respiratory: Effort normal. No respiratory distress.  GI: Soft. There is no tenderness.  Genitourinary: Vagina normal. Vulva exhibits no lesion.  Musculoskeletal: She exhibits no edema or tenderness.  Neurological: She is alert and oriented to person, place, and time. She has normal reflexes.  Skin: Skin is warm and dry.  Psychiatric: She has a normal mood and affect.    Prenatal labs: ABO, Rh: A/POS/-- (02/07 1331) Antibody: NEG (02/07 1331) Rubella: 1.46 (02/07 1331) RPR: NON  REAC (06/05 0001)  HBsAg: NEGATIVE (02/07 1331)  HIV: NONREACTIVE (06/05 0001)  GBS:  Pos  Assessment: 1. Labor: Active 2. Fetal Wellbeing: Category I-II, overall reassuring  3. Pain Control: Fentanyl.  4. GBS: Pos 5. 37.3 week IUP  Plan:  1. Admit to BS per consult with MD 2. Routine L&D orders 3. Analgesia/anesthesia PRN  4. PCN  Manya Silvas 04/12/2016, 12:27 PM

## 2016-04-12 NOTE — MAU Note (Signed)
Notified Manya Silvas CNM patient had second cervical exam now 4.5/100/-1 per CNM observe patient x 1 more hour and given her pain medication IV

## 2016-04-12 NOTE — MAU Note (Signed)
Patient up to bathroom at 13:36, encountered patient to come out of bathroom back in bed. Daiva Nakayama CNM at bedside

## 2016-04-12 NOTE — MAU Note (Signed)
Notified Marlou Porch CNM in department variable decel to 85 to 90 for 10 seconds, patient repositioned to left lateral, CNM reviewed efm strip

## 2016-04-12 NOTE — MAU Note (Signed)
At 1033 Patient had variable deceleration to 90s, patient left lateral position, readjusted cardio, efm back up to 120, Michelle Piper CNM at bedside reviewed efm strip.

## 2016-04-12 NOTE — MAU Note (Signed)
Called Carleene Overlie RN BS Charge no bed available at this time hopefully within 30 minutes.

## 2016-04-12 NOTE — MAU Note (Signed)
Contractions started at 0200, became regular at 0430. Now every 6-58min.  Has not been checked. Denies problems with preg.  Denies bleeding or leaking

## 2016-04-12 NOTE — Progress Notes (Signed)
Patient desires to po hydrate stating she has been drinking fluids all night.

## 2016-04-13 DIAGNOSIS — IMO0001 Reserved for inherently not codable concepts without codable children: Secondary | ICD-10-CM

## 2016-04-13 LAB — RPR: RPR: NONREACTIVE

## 2016-04-13 NOTE — Progress Notes (Signed)
Post Partum Day 1 Subjective: No problems or concerns. Patient states doing well. Reports minimal bleeding and pain controlled with pain medications. Denies calf pain.  Bottlefeeding.  .    Objective: Blood pressure 118/79, pulse 60, temperature 97.7 F (36.5 C), temperature source Oral, resp. rate 18, weight 169 lb (76.7 kg), last menstrual period 07/15/2015, SpO2 98 %, unknown if currently breastfeeding.  Physical Exam:  General: alert, cooperative and appears stated age Lochia: appropriate Uterine Fundus: firm Incision: n/a DVT Evaluation: No evidence of DVT seen on physical exam. Negative Homan's sign.   Recent Labs  04/12/16 1047  HGB 11.2*  HCT 33.7*    Assessment/Plan: Plan for discharge tomorrow   LOS: 1 day   Gwen Pounds 04/13/2016, 7:56 AM

## 2016-04-13 NOTE — Progress Notes (Signed)
UR chart review completed.  

## 2016-04-14 NOTE — Discharge Instructions (Signed)

## 2016-04-14 NOTE — Discharge Summary (Signed)
OB Discharge Summary     Patient Name: Meredith Delacruz DOB: 04/25/1991 MRN: [id number]  Date of admission: 04/12/2016 Delivering MD: Koren Shiver D   Date of discharge: 04/14/2016  Admitting diagnosis: 37 weeks discomfort and contractions Intrauterine pregnancy: [redacted]w[redacted]d     Secondary diagnosis:  Active Problems:   Active labor at term  Additional problems: none     Discharge diagnosis: Term Pregnancy Delivered                                                                                                Post partum procedures:none  Augmentation: none  Complications: None  Hospital course:  Onset of Labor With Vaginal Delivery     25 y.o. yo G2P2003 at [redacted]w[redacted]d was admitted in Active Labor on 04/12/2016. Patient had an uncomplicated labor course as follows:  Membrane Rupture Time/Date: 1:48 PM ,04/12/2016   Intrapartum Procedures: Episiotomy:                                           Lacerations:  None [1]  Patient had a delivery of a Viable infant. 04/12/2016  Information for the patient's newborn:  Kiyanna, [place] Girl Alfreda [id number]  Delivery Method: Vaginal, Spontaneous Delivery (Filed from Delivery Summary)    Pateint had an uncomplicated postpartum course.  She is ambulating, tolerating a regular diet, passing flatus, and urinating well. Patient is discharged home in stable condition on 04/14/16.    Physical exam  Vitals:   04/13/16 0410 04/13/16 0600 04/13/16 1805 04/14/16 0624  BP: 122/73 118/79 120/85 115/61  Pulse: 63 60 68 (!) 50  Resp: 18 18 18 18   Temp: 97.9 F (36.6 C) 97.7 F (36.5 C) 97.9 F (36.6 C) 98.1 F (36.7 C)  TempSrc: Oral Oral Oral Oral  SpO2:  98%    Weight:      Height:       General: alert, cooperative and no distress Lochia: appropriate Uterine Fundus: firm Incision: N/A DVT Evaluation: No evidence of DVT seen on physical exam. No significant calf/ankle edema. Labs: Lab Results  Component Value Date   WBC 10.2 04/12/2016   HGB  11.2 (L) 04/12/2016   HCT 33.7 (L) 04/12/2016   MCV 76.1 (L) 04/12/2016   PLT 210 04/12/2016   CMP Latest Ref Rng & Units 02/07/2016  Glucose 65 - 104 mg/dL 79  BUN 6 - 20 mg/dL -  Creatinine 0.44 - 1.00 mg/dL -  Sodium 135 - 145 mmol/L -  Potassium 3.5 - 5.1 mmol/L -  Chloride 101 - 111 mmol/L -  CO2 22 - 32 mmol/L -  Calcium 8.9 - 10.3 mg/dL -  Total Protein 6.5 - 8.1 g/dL -  Total Bilirubin 0.3 - 1.2 mg/dL -  Alkaline Phos 38 - 126 U/L -  AST 15 - 41 U/L -  ALT 14 - 54 U/L -    Discharge instruction: per After Visit Summary and "Baby and Me Booklet".  After visit meds:    Medication List  STOP taking these medications   cyclobenzaprine 10 MG tablet Commonly known as:  FLEXERIL   ranitidine 150 MG tablet Commonly known as:  ZANTAC     TAKE these medications   acetaminophen 500 MG tablet Commonly known as:  TYLENOL Take 1,000 mg by mouth every 6 (six) hours as needed for mild pain, moderate pain, fever or headache. Reported on 6/19/201   flintstones complete 60 MG chewable tablet Chew 2 tablets by mouth daily.       Diet: routine diet  Activity: Advance as tolerated. Pelvic rest for 6 weeks.   Outpatient follow up:6 weeks Follow up Appt:No future appointments. Follow up Visit:No Follow-up on file.  Postpartum contraception: Nexplanon  Newborn Data: Live born female  Birth Weight: 5 lb 3.3 oz (2360 g) APGAR: 9, 9  Baby Feeding: Bottle Disposition:home with mother   04/14/2016 Jacquiline Doe, MD

## 2016-04-16 DIAGNOSIS — Z8759 Personal history of other complications of pregnancy, childbirth and the puerperium: Secondary | ICD-10-CM

## 2016-04-22 ENCOUNTER — Encounter: Payer: Self-pay | Admitting: *Deleted

## 2016-04-27 ENCOUNTER — Telehealth: Payer: Self-pay

## 2016-04-27 NOTE — Telephone Encounter (Signed)
Notified pt that her Return to Work form is completed and that the return to work date was changed from her written date to six weeks from her delivery date.  Call was disconnected.  Called pt back and LM to come by the office to pick up form and if she has any questions to please give [place] a call back.

## 2016-05-03 ENCOUNTER — Emergency Department (HOSPITAL_COMMUNITY)
Admission: EM | Admit: 2016-05-03 | Discharge: 2016-05-03 | Disposition: A | Discharge status: Home / Self Care | Payer: Medicaid Other | Attending: Emergency Medicine | Admitting: Emergency Medicine

## 2016-05-03 DIAGNOSIS — K0889 Other specified disorders of teeth and supporting structures: Secondary | ICD-10-CM | POA: Diagnosis not present

## 2016-05-03 DIAGNOSIS — Z79899 Other long term (current) drug therapy: Secondary | ICD-10-CM | POA: Insufficient documentation

## 2016-05-03 MED ORDER — PENICILLIN V POTASSIUM 500 MG PO TABS
500.0000 mg | ORAL_TABLET | Freq: Four times a day (QID) | ORAL | 0 refills | Status: AC
Start: 2016-05-03 — End: 2016-05-10

## 2016-05-03 MED ORDER — IBUPROFEN 200 MG PO TABS
600.0000 mg | ORAL_TABLET | Freq: Once | ORAL | Status: AC
  Administered 2016-05-03: 600 mg via ORAL
  Filled 2016-05-03: qty 3

## 2016-05-03 MED ORDER — OXYCODONE-ACETAMINOPHEN 5-325 MG PO TABS
1.0000 | ORAL_TABLET | Freq: Once | ORAL | Status: AC
  Administered 2016-05-03: 1 via ORAL
  Filled 2016-05-03: qty 1

## 2016-05-03 NOTE — ED Triage Notes (Signed)
Pt states that she has had R upper wisdom tooth pain x 1 week. Tried ibuprofen w/o relief. Alert and oriented.

## 2016-05-03 NOTE — ED Provider Notes (Signed)
[facility] DEPT Provider Note   CSN: [id number] Arrival date & time: 05/03/16  2200     History   Chief Complaint Chief Complaint  Patient presents with  . Dental Pain    HPI Meredith Delacruz is a 25 y.o. female.  25 year old Caucasian femalepast medical history presents with dental pain this evening. Patient states the pain started approximately one week ago. The pain is located to her right upper molar. She has tried ibuprofen with little relief. Nothing makes better worse. Pain is gradually worsened. Has appointment to see dentist on Tuesday. Denies any fever, chills, facial swelling.       Past Medical History:  Diagnosis Date  . GERD (gastroesophageal reflux disease)   . Infection    UTI  . Trichomonal infection     Patient Active Problem List   Diagnosis Date Noted  . History of prior pregnancy with IUGR newborn 04/16/2016  . GERD (gastroesophageal reflux disease) 03/09/2016  . GBS (group B streptococcus) UTI complicating pregnancy [id number]  . Normal delivery at term 10/08/2015    Past Surgical History:  Procedure Laterality Date  . NO PAST SURGERIES      OB History    Gravida Para Term Preterm AB Living   2 2 2     3    SAB TAB Ectopic Multiple Live Births         0 2       Home Medications    Prior to Admission medications   Medication Sig Start Date End Date Taking? Authorizing Provider  acetaminophen (TYLENOL) 500 MG tablet Take 1,000 mg by mouth every 6 (six) hours as needed for mild pain, moderate pain, fever or headache. Reported on 6/19/201    Historical Provider, MD  flintstones complete (FLINTSTONES) 60 MG chewable tablet Chew 2 tablets by mouth daily.    Historical Provider, MD  penicillin v potassium (VEETID) 500 MG tablet Take 1 tablet (500 mg total) by mouth 4 (four) times daily. 05/03/16 05/10/16  Fatima Blank, MD    Family History Family History  Problem Relation Age of Onset  . Asthma Neg Hx   . Cancer Neg Hx   .  Hearing loss Neg Hx   . Heart disease Neg Hx   . Hypertension Neg Hx   . Kidney disease Neg Hx   . Stroke Neg Hx     Social History Social History  Substance Use Topics  . Smoking status: Never Smoker  . Smokeless tobacco: Never Used  . Alcohol use Yes     Comment: socially before pregnancy     Allergies   Review of patient's allergies indicates no known allergies.   Review of Systems Review of Systems  Constitutional: Negative for chills and fever.  HENT: Positive for dental problem. Negative for ear pain, facial swelling and sore throat.   Eyes: Negative for pain and visual disturbance.  Respiratory: Negative for cough and shortness of breath.   Cardiovascular: Negative for chest pain and palpitations.  Gastrointestinal: Negative for abdominal pain, nausea and vomiting.  Genitourinary: Negative for dysuria and hematuria.  Musculoskeletal: Negative for arthralgias and back pain.  Skin: Negative for color change and rash.  Neurological: Negative for dizziness, seizures and headaches.  All other systems reviewed and are negative.    Physical Exam Updated Vital Signs BP (!) 141/110   Pulse 60   Temp 99.1 F (37.3 C)   Resp 15   Ht 5\' 1"  (1.549 m)   Wt 70.3 kg  SpO2 100%   BMI 29.29 kg/m   Physical Exam  Constitutional: She appears well-developed and well-nourished. No distress.  HENT:  Head: Normocephalic and atraumatic.  Right Ear: External ear normal.  Left Ear: External ear normal.  Nose: Nose normal.  Mouth/Throat: Oropharynx is clear and moist and mucous membranes are normal. No dental abscesses or dental caries.    No facial swelling appreciated.   Eyes: Right eye exhibits no discharge. Left eye exhibits no discharge. No scleral icterus.  Neck: Normal range of motion. Neck supple.  Pulmonary/Chest: No respiratory distress.  Musculoskeletal: Normal range of motion.  Lymphadenopathy:    She has no cervical adenopathy.  Neurological: She is alert.    Skin: No pallor.  Nursing note and vitals reviewed.    ED Treatments / Results  Labs (all labs ordered are listed, but only abnormal results are displayed) Labs Reviewed - No data to display  EKG  EKG Interpretation None       Radiology No results found.  Procedures Procedures (including critical care time)  Medications Ordered in ED Medications  oxyCODONE-acetaminophen (PERCOCET/ROXICET) 5-325 MG per tablet 1 tablet (1 tablet Oral Given 05/03/16 2254)  ibuprofen (ADVIL,MOTRIN) tablet 600 mg (600 mg Oral Given 05/03/16 2254)     Initial Impression / Assessment and Plan / ED Course  I have reviewed the triage vital signs and the nursing notes.  Pertinent labs & imaging results that were available during my care of the patient were reviewed by me and considered in my medical decision making (see chart for details).  Clinical Course  Patient with toothache.  No gross abscess.  Exam unconcerning for Ludwig's angina or spread of infection.  Will treat with penicillin and pain medicine.  Urged patient to follow-up with dentist.  Blood pressure elevated in ED. Likely due to pain. No headache or vision changes. Patient to follow up with primary care provider to assess blood pressure. Patient verbalized understanding And discharged home in NAD with stable VS.   Final Clinical Impressions(s) / ED Diagnoses   Final diagnoses:  Pain, dental    New Prescriptions New Prescriptions   PENICILLIN V POTASSIUM (VEETID) 500 MG TABLET    Take 1 tablet (500 mg total) by mouth 4 (four) times daily.     Doristine Devoid, PA-C 05/03/16 2300    Doristine Devoid, PA-C 05/04/16 0007    Fatima Blank, MD 05/04/16 [phone]

## 2016-05-03 NOTE — Discharge Instructions (Signed)
May take ibuprofen and tylenol for pain. Take antibiotics 4 times per day for 7 days. Follow up with dentist on Tuesday.

## 2016-05-27 ENCOUNTER — Encounter: Payer: Self-pay | Admitting: Advanced Practice Midwife

## 2016-05-27 ENCOUNTER — Ambulatory Visit (INDEPENDENT_AMBULATORY_CARE_PROVIDER_SITE_OTHER): Payer: Medicaid Other | Admitting: Advanced Practice Midwife

## 2016-05-27 DIAGNOSIS — Z8742 Personal history of other diseases of the female genital tract: Secondary | ICD-10-CM

## 2016-05-27 DIAGNOSIS — Z8759 Personal history of other complications of pregnancy, childbirth and the puerperium: Secondary | ICD-10-CM

## 2016-05-27 DIAGNOSIS — Z3009 Encounter for other general counseling and advice on contraception: Secondary | ICD-10-CM

## 2016-05-27 LAB — POCT PREGNANCY, URINE: PREG TEST UR: NEGATIVE

## 2016-05-27 MED ORDER — LEVONORGEST-ETH ESTRAD 91-DAY 0.15-0.03 &0.01 MG PO TABS: 1.0000 | ORAL_TABLET | Freq: Every day | ORAL | 4 refills | Status: AC

## 2016-05-27 NOTE — Patient Instructions (Signed)
Laparoscopic Tubal Ligation Laparoscopic tubal ligation is a procedure that closes the fallopian tubes at a time other than right after childbirth. When the fallopian tubes are closed, the eggs that are released from the ovaries cannot enter the uterus, and sperm cannot reach the egg. Tubal ligation is also known as getting your "tubes tied." Tubal ligation is done so you will not be able to get pregnant or have a baby. Although this procedure may be undone (reversed), it should be considered permanent and irreversible. If you want to have future pregnancies, you should not have this procedure. LET [facility] CARE PROVIDER KNOW ABOUT:  Any allergies you have.  All medicines you are taking, including vitamins, herbs, eye drops, creams, and over-the-counter medicines. This includes any use of steroids, either by mouth or in cream form.  Previous problems you or members of your family have had with the use of anesthetics.  Any blood disorders you have.  Previous surgeries you have had.  Any medical conditions you may have.  Possibility of pregnancy, if this applies.  Any past pregnancies. RISKS AND COMPLICATIONS  Infection.  Bleeding.  Injury to surrounding organs.  Side effects from anesthetics.  Failure of the procedure.  Ectopic pregnancy.  Future regret about having the procedure done. BEFORE THE PROCEDURE  Ask your health care provider about:  Changing or stopping your regular medicines. This is especially important if you are taking diabetes medicines or blood thinners.  Taking medicines such as aspirin and ibuprofen. These medicines can thin your blood. Do not take these medicines before your procedure if your health care provider instructs you not to.  Follow instructions from your health care provider about eating and drinking restrictions.  Plan to have someone take you home after the procedure.  If you go home right after the procedure, plan to have someone  with you for 24 hours. PROCEDURE  You will be given one or more of the following:  A medicine that helps you relax (sedative).  A medicine that numbs the area (local anesthetic).  A medicine that makes you fall asleep (general anesthetic).  A medicine that is injected into an area of your body that numbs everything below the injection site (regional anesthetic).  If you have been given general anesthetic, a tube will be put down your throat to help you breathe.  Two small cuts (incisions) will be made in the lower abdominal area and near the belly button.  Your bladder may be emptied with a small tube (catheter).  Your abdomen will be inflated with a safe gas (carbon dioxide). This will help to give the surgeon room to operate and visualize, and it will help the surgeon to avoid other organs.  A thin, lighted tube (laparoscope) with a camera attached will be inserted into your abdomen through one of the incisions near the belly button. Other small instruments will be inserted through the other abdominal incision.  The fallopian tubes will be tied off or burned (cauterized), or they will be blocked with a clip, ring, or clamp. In many cases, a small portion in the center of each fallopian tube will also be removed.  After the fallopian tubes are blocked, the gas will be released from the abdomen.  The incisions will be closed with stitches (sutures).  A bandage (dressing) will be placed over the incisions. The procedure may vary among health care providers and hospitals. AFTER THE PROCEDURE  Your blood pressure, heart rate, breathing rate, and blood oxygen level  will be monitored often until the medicines you were given have worn off.  You will be given pain medicine as needed.  If you had general anesthetic, you may have some mild discomfort in your throat. This is from the breathing tube that was placed in your throat while you were sleeping.  You may experience discomfort in  the shoulder area from some trapped air between your liver and your diaphragm. This sensation is normal, and it will slowly go away on its own.  You will have some mild abdominal discomfort for 3--7 days.   This information is not intended to replace advice given to you by your health care provider. Make sure you discuss any questions you have with your health care provider.   Document Released: 11/23/2000 Document Revised: 01/01/2015 Document Reviewed: 11/28/2011 Elsevier Interactive Patient Education 2016 [place] What is this medicine? ETONOGESTREL (et oh noe JES trel) is a contraceptive (birth control) device. It is used to prevent pregnancy. It can be used for up to 3 years. This medicine may be used for other purposes; ask your health care provider or pharmacist if you have questions. What should I tell my health care provider before I take this medicine? They need to know if you have any of these conditions: -abnormal vaginal bleeding -blood vessel disease or blood clots -cancer of the breast, cervix, or liver -depression -diabetes -gallbladder disease -headaches -heart disease or recent heart attack -high blood pressure -high cholesterol -kidney disease -liver disease -renal disease -seizures -tobacco smoker -an unusual or allergic reaction to etonogestrel, other hormones, anesthetics or antiseptics, medicines, foods, dyes, or preservatives -pregnant or trying to get pregnant -breast-feeding How should I use this medicine? This device is inserted just under the skin on the inner side of your upper arm by a health care professional. Talk to your pediatrician regarding the use of this medicine in children. Special care may be needed. Overdosage: If you think you have taken too much of this medicine contact a poison control center or emergency room at once. NOTE: This medicine is only for you. Do not share this medicine with others. What if I  miss a dose? This does not apply. What may interact with this medicine? Do not take this medicine with any of the following medications: -amprenavir -bosentan -fosamprenavir This medicine may also interact with the following medications: -barbiturate medicines for inducing sleep or treating seizures -certain medicines for fungal infections like ketoconazole and itraconazole -griseofulvin -medicines to treat seizures like carbamazepine, felbamate, oxcarbazepine, phenytoin, topiramate -modafinil -phenylbutazone -rifampin -some medicines to treat HIV infection like atazanavir, indinavir, lopinavir, nelfinavir, tipranavir, ritonavir -St. John's wort This list may not describe all possible interactions. Give your health care provider a list of all the medicines, herbs, non-prescription drugs, or dietary supplements you use. Also tell them if you smoke, drink alcohol, or use illegal drugs. Some items may interact with your medicine. What should I watch for while using this medicine? This product does not protect you against HIV infection (AIDS) or other sexually transmitted diseases. You should be able to feel the implant by pressing your fingertips over the skin where it was inserted. Contact your doctor if you cannot feel the implant, and use a non-hormonal birth control method (such as condoms) until your doctor confirms that the implant is in place. If you feel that the implant may have broken or become bent while in your arm, contact your healthcare provider. What side effects may I notice from  receiving this medicine? Side effects that you should report to your doctor or health care professional as soon as possible: -allergic reactions like skin rash, itching or hives, swelling of the face, lips, or tongue -breast lumps -changes in emotions or moods -depressed mood -heavy or prolonged menstrual bleeding -pain, irritation, swelling, or bruising at the insertion site -scar at site of  insertion -signs of infection at the insertion site such as fever, and skin redness, pain or discharge -signs of pregnancy -signs and symptoms of a blood clot such as breathing problems; changes in vision; chest pain; severe, sudden headache; pain, swelling, warmth in the leg; trouble speaking; sudden numbness or weakness of the face, arm or leg -signs and symptoms of liver injury like dark yellow or brown urine; general ill feeling or flu-like symptoms; light-colored stools; loss of appetite; nausea; right upper belly pain; unusually weak or tired; yellowing of the eyes or skin -unusual vaginal bleeding, discharge -signs and symptoms of a stroke like changes in vision; confusion; trouble speaking or understanding; severe headaches; sudden numbness or weakness of the face, arm or leg; trouble walking; dizziness; loss of balance or coordination Side effects that usually do not require medical attention (Report these to your doctor or health care professional if they continue or are bothersome.): -acne -back pain -breast pain -changes in weight -dizziness -general ill feeling or flu-like symptoms -headache -irregular menstrual bleeding -nausea -sore throat -vaginal irritation or inflammation This list may not describe all possible side effects. Call your doctor for medical advice about side effects. You may report side effects to FDA at 1-800-FDA-1088. Where should I keep my medicine? This drug is given in a hospital or clinic and will not be stored at home. NOTE: This sheet is a summary. It may not cover all possible information. If you have questions about this medicine, talk to your doctor, pharmacist, or health care provider.    2016, Elsevier/Gold Standard. (2014-06-01 14:07:06)   Levonorgestrel intrauterine device (IUD) What is this medicine? LEVONORGESTREL IUD (LEE voe nor jes trel) is a contraceptive (birth control) device. The device is placed inside the uterus by a healthcare  professional. It is used to prevent pregnancy and can also be used to treat heavy bleeding that occurs during your period. Depending on the device, it can be used for 3 to 5 years. This medicine may be used for other purposes; ask your health care provider or pharmacist if you have questions. What should I tell my health care provider before I take this medicine? They need to know if you have any of these conditions: -abnormal Pap smear -cancer of the breast, uterus, or cervix -diabetes -endometritis -genital or pelvic infection now or in the past -have more than one sexual partner or your partner has more than one partner -heart disease -history of an ectopic or tubal pregnancy -immune system problems -IUD in place -liver disease or tumor -problems with blood clots or take blood-thinners -use intravenous drugs -uterus of unusual shape -vaginal bleeding that has not been explained -an unusual or allergic reaction to levonorgestrel, other hormones, silicone, or polyethylene, medicines, foods, dyes, or preservatives -pregnant or trying to get pregnant -breast-feeding How should I use this medicine? This device is placed inside the uterus by a health care professional. Talk to your pediatrician regarding the use of this medicine in children. Special care may be needed. Overdosage: If you think you have taken too much of this medicine contact a poison control center or emergency room at once.  NOTE: This medicine is only for you. Do not share this medicine with others. What if I miss a dose? This does not apply. What may interact with this medicine? Do not take this medicine with any of the following medications: -amprenavir -bosentan -fosamprenavir This medicine may also interact with the following medications: -aprepitant -barbiturate medicines for inducing sleep or treating seizures -bexarotene -griseofulvin -medicines to treat seizures like carbamazepine, ethotoin, felbamate,  oxcarbazepine, phenytoin, topiramate -modafinil -pioglitazone -rifabutin -rifampin -rifapentine -some medicines to treat HIV infection like atazanavir, indinavir, lopinavir, nelfinavir, tipranavir, ritonavir -St. John's wort -warfarin This list may not describe all possible interactions. Give your health care provider a list of all the medicines, herbs, non-prescription drugs, or dietary supplements you use. Also tell them if you smoke, drink alcohol, or use illegal drugs. Some items may interact with your medicine. What should I watch for while using this medicine? Visit your doctor or health care professional for regular check ups. See your doctor if you or your partner has sexual contact with others, becomes HIV positive, or gets a sexual transmitted disease. This product does not protect you against HIV infection (AIDS) or other sexually transmitted diseases. You can check the placement of the IUD yourself by reaching up to the top of your vagina with clean fingers to feel the threads. Do not pull on the threads. It is a good habit to check placement after each menstrual period. Call your doctor right away if you feel more of the IUD than just the threads or if you cannot feel the threads at all. The IUD may come out by itself. You may become pregnant if the device comes out. If you notice that the IUD has come out use a backup birth control method like condoms and call your health care provider. Using tampons will not change the position of the IUD and are okay to use during your period. What side effects may I notice from receiving this medicine? Side effects that you should report to your doctor or health care professional as soon as possible: -allergic reactions like skin rash, itching or hives, swelling of the face, lips, or tongue -fever, flu-like symptoms -genital sores -high blood pressure -no menstrual period for 6 weeks during use -pain, swelling, warmth in the leg -pelvic pain  or tenderness -severe or sudden headache -signs of pregnancy -stomach cramping -sudden shortness of breath -trouble with balance, talking, or walking -unusual vaginal bleeding, discharge -yellowing of the eyes or skin Side effects that usually do not require medical attention (report to your doctor or health care professional if they continue or are bothersome): -acne -breast pain -change in sex drive or performance -changes in weight -cramping, dizziness, or faintness while the device is being inserted -headache -irregular menstrual bleeding within first 3 to 6 months of use -nausea This list may not describe all possible side effects. Call your doctor for medical advice about side effects. You may report side effects to FDA at 1-800-FDA-1088. Where should I keep my medicine? This does not apply. NOTE: This sheet is a summary. It may not cover all possible information. If you have questions about this medicine, talk to your doctor, pharmacist, or health care provider.    2016, Elsevier/Gold Standard. (2011-09-17 13:54:04)

## 2016-05-27 NOTE — Progress Notes (Signed)
Subjective:     Meredith Delacruz is a 25 y.o. female who presents for a postpartum visit. She is 6 weeks postpartum following a spontaneous vaginal delivery. I have fully reviewed the prenatal and intrapartum course. The delivery was at 20 gestational weeks. Outcome: spontaneous vaginal delivery. Anesthesia: none. Postpartum course has been unremarkable. Baby's course has been unremarkable. Baby is feeding by bottle - Similac Alimentum. Bleeding no bleeding. Bowel function is normal. Bladder function is normal. Patient is not sexually active. Contraception method is none. Considering tubal ligation versus LARC. Has not signed tubal consent. Postpartum depression screening: negative.  The following portions of the patient's history were reviewed and updated as appropriate: allergies, current medications, past family history, past medical history, past social history, past surgical history and problem list.  Review of Systems Pertinent items are noted in HPI.   Objective:    BP 112/78   Pulse 82   Wt 161 lb 1.6 oz (73.1 kg)   LMP 05/20/2016   Breastfeeding? No   BMI 30.44 kg/m   General:  alert, cooperative, appears stated age and no distress   Breasts:  inspection negative, no nipple discharge or bleeding, no masses or nodularity palpable  Lungs: clear to auscultation bilaterally  Heart:  regular rate and rhythm, S1, S2 normal, no murmur, click, rub or gallop  Abdomen: soft, non-tender; bowel sounds normal; no masses,  no organomegaly   Vulva:  Declined exam        Assessment:    normal postpartum exam. Pap smear not done at today's visit.   Plan:    1. Contraception: OCP (estrogen/progesterone) 2. Explained Medicaid consent requirements and that pregnancy Medicaid would soon be expiring. Recommended the patient sign tubal consent as soon as possible and schedule preop visit if she is certain that she would like a tubal ligation. Also discussed Nexplanon and IUDs is highly effective,  but reversible alternatives. Patient can't decide what she wants to do long-term. Would like OCPs for now. Handouts given. 3. Follow up in one year for annual exam or as needed for birth control.

## 2016-06-03 ENCOUNTER — Ambulatory Visit: Payer: Medicaid Other | Admitting: Obstetrics and Gynecology

## 2016-06-04 ENCOUNTER — Encounter: Payer: Self-pay | Admitting: *Deleted

## 2017-07-11 IMAGING — US US OB TRANSVAGINAL
1 series · 15 of 28 positions shown · non-contrast
Comparison: 08/30/2015.

CLINICAL DATA: Pregnancy.  Evaluate for viability.

EXAM:
OBSTETRIC <14 WK US AND TRANSVAGINAL OB US
TECHNIQUE: Both transabdominal and transvaginal ultrasound examinations were
performed for complete evaluation of the gestation as well as the
maternal uterus, adnexal regions, and pelvic cul-de-sac.
Transvaginal technique was performed to assess early pregnancy.

[Series 1: us ob transvaginal · 59 acquisitions, 15 frames shown]
[im 1/59]
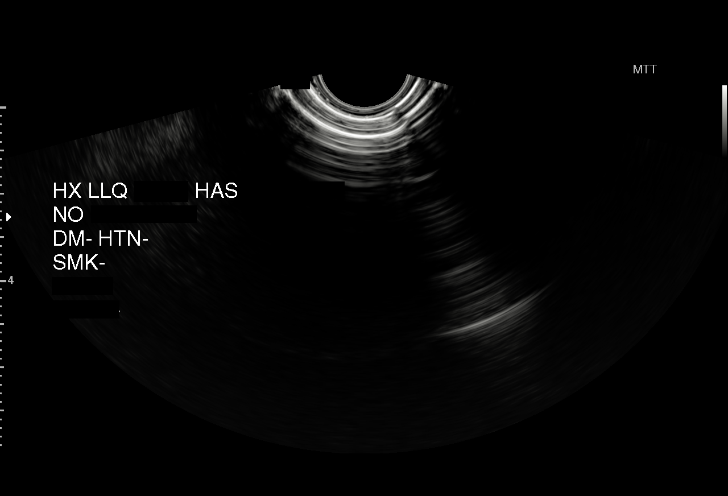
[im 5/59]
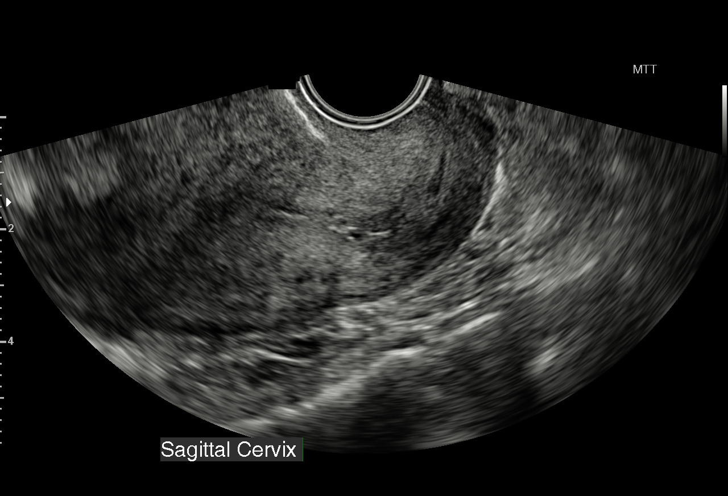
[im 9/59]
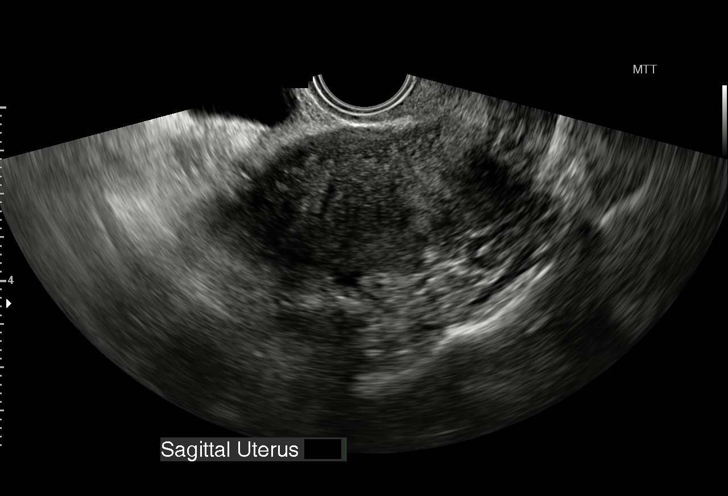
[im 13/59]
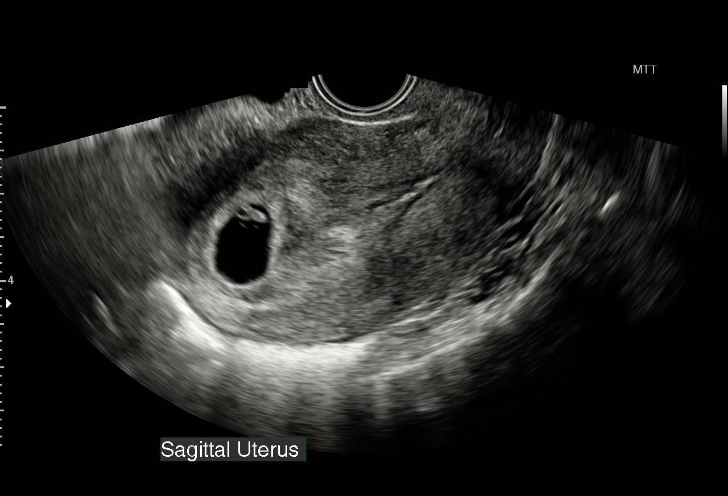
[im 18/59]
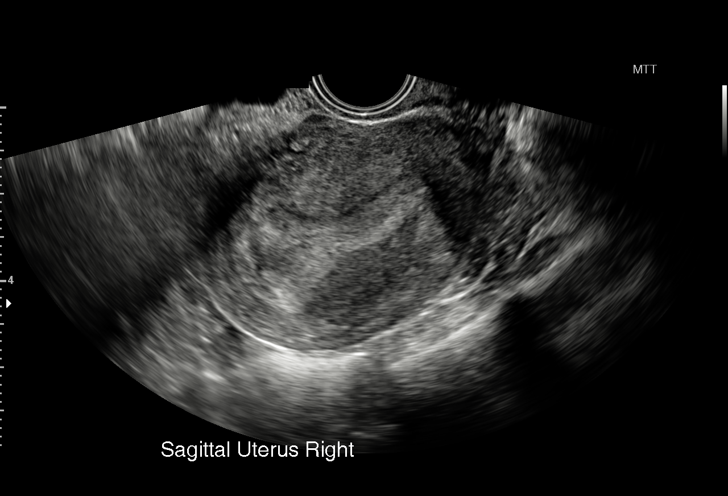
[im 22/59]
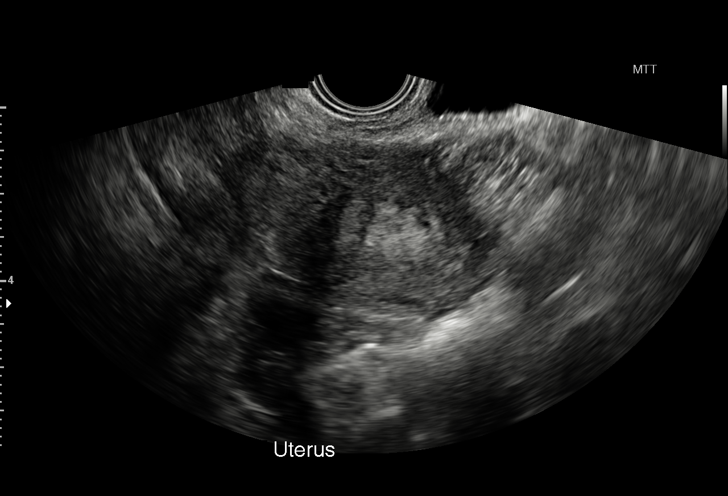
[im 26/59]
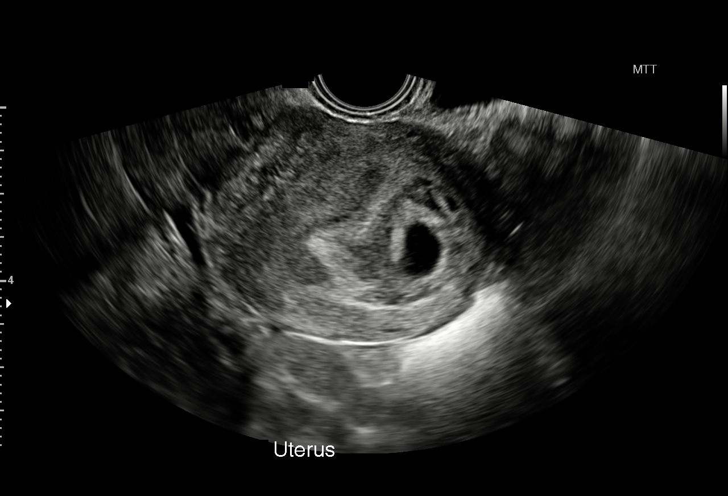
[im 31/59]
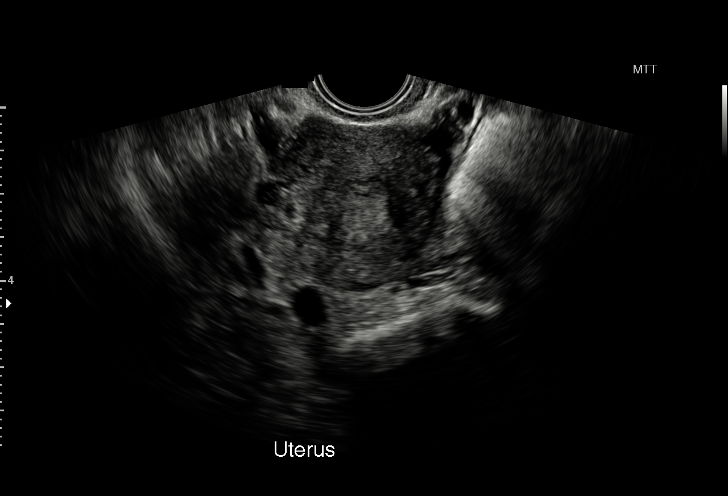
[im 33/59]
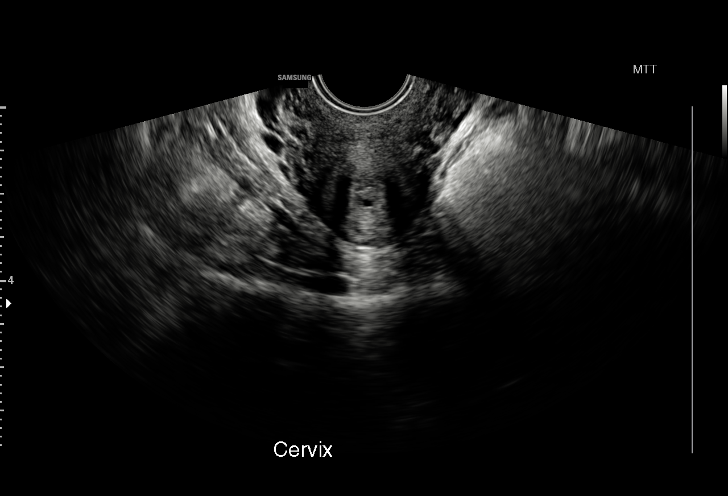
[im 37/59]
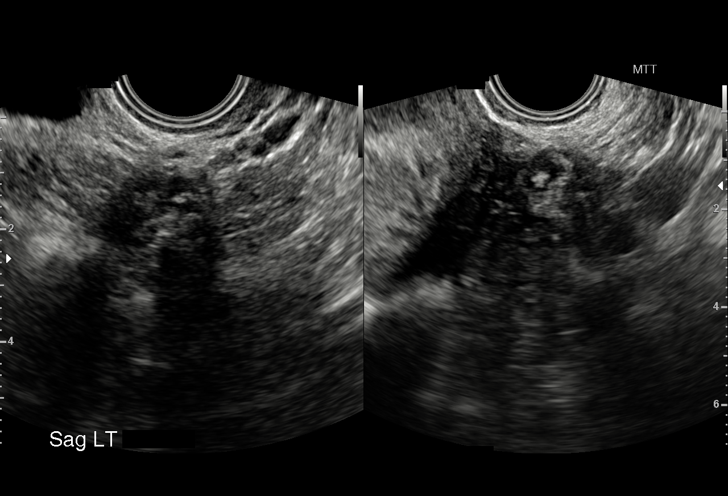
[im 41/59]
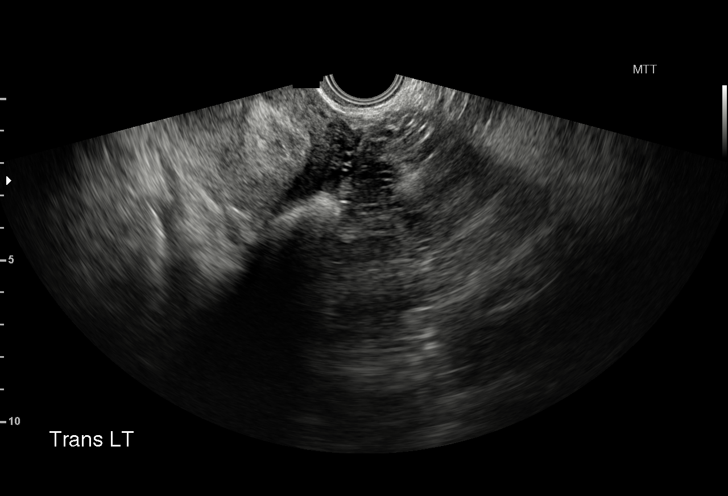
[im 46/59]
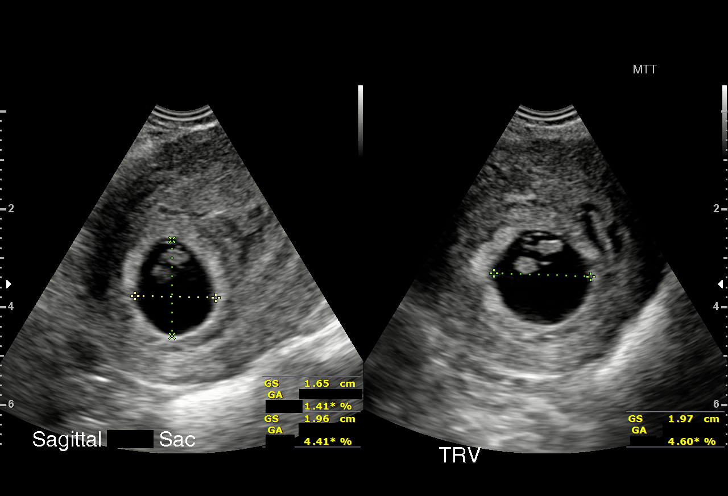
[im 50/59]
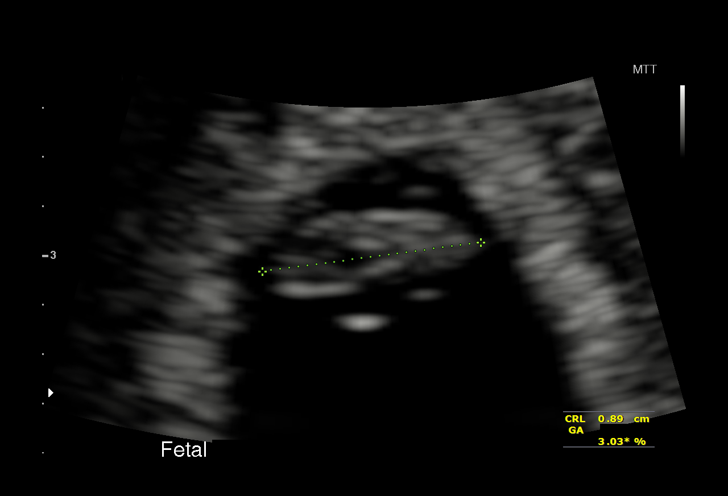
[im 54/59]
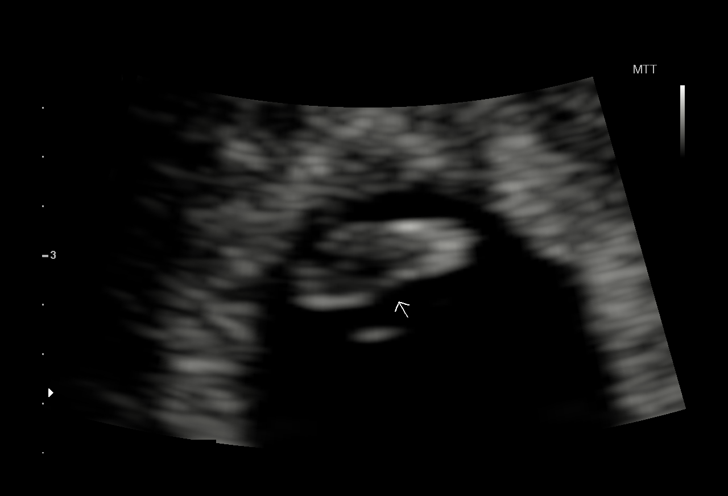
[im 59/59]
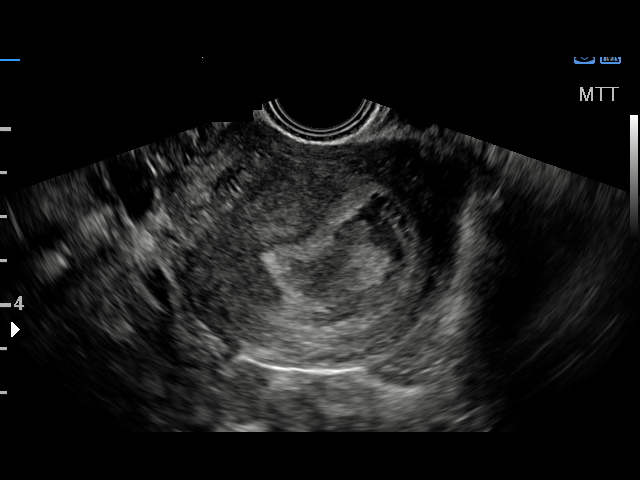

[15 of 28 positions shown; findings below may reference images not displayed]

FINDINGS: Intrauterine gestational sac: Visualized/normal in shape.

Yolk sac:  Present.

Embryo:  Present.

Cardiac Activity: Present.

Heart Rate: 143  bpm

CRL:  0.8 cm 6 w   5 d                  US EDC: 04/30/2016

Subchorionic hemorrhage:  None visualized.

Maternal uterus/adnexae: No prominent subchorionic hemorrhage. Small
corpus luteal cyst noted right ovary.
IMPRESSION: Single viable intrauterine pregnancy at 6 weeks 5 days.

## 2017-07-17 IMAGING — US US OB TRANSVAGINAL
1 series · 13 of 28 positions shown · non-contrast
Comparison: None.

CLINICAL DATA: 24-year-old female with abdominal and left pelvic
pain in the first trimester pregnancy. Initial encounter.

EXAM:
OBSTETRIC <14 WK US AND TRANSVAGINAL OB US
TECHNIQUE: Both transabdominal and transvaginal ultrasound examinations were
performed for complete evaluation of the gestation as well as the
maternal uterus, adnexal regions, and pelvic cul-de-sac.
Transvaginal technique was performed to assess early pregnancy.

[Series 1: us ob transvaginal · 0.25mm/px · 13 of 69 slices shown]
[im 3/69]
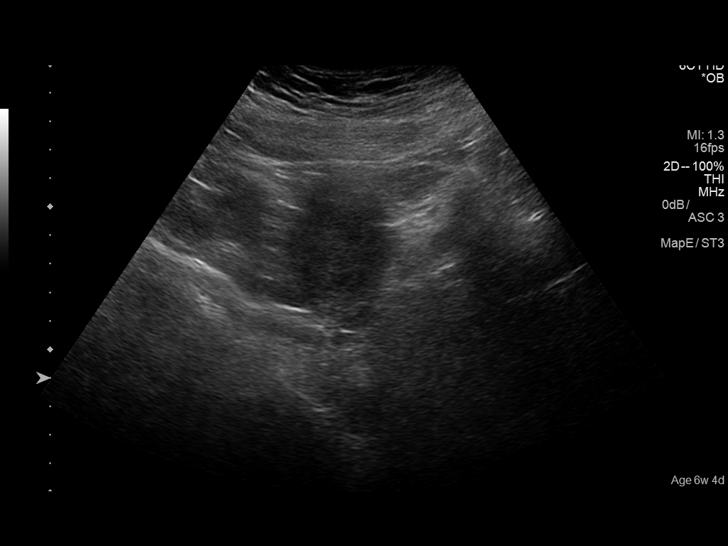
[im 8/69]
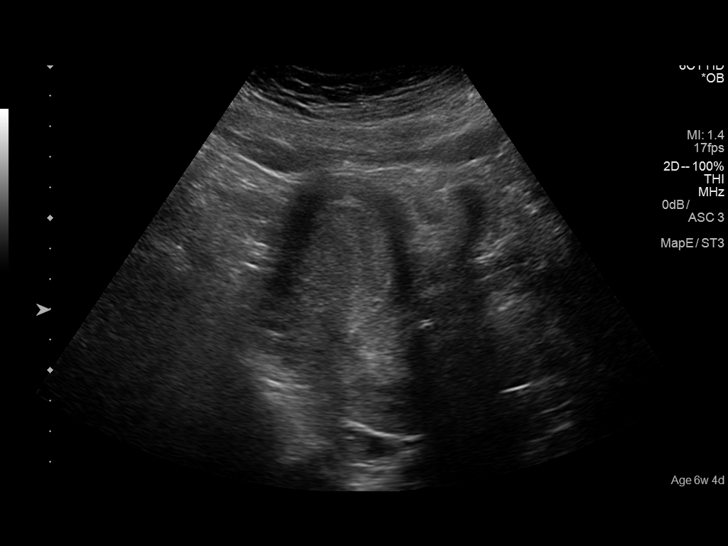
[im 13/69]
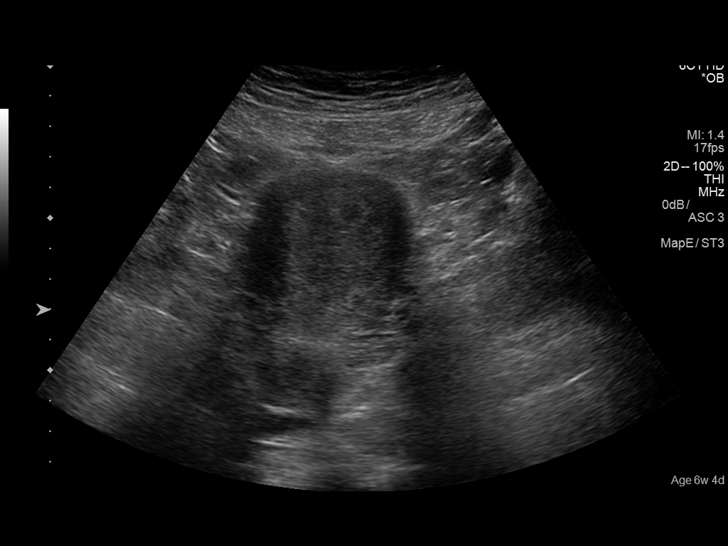
[im 18/69]
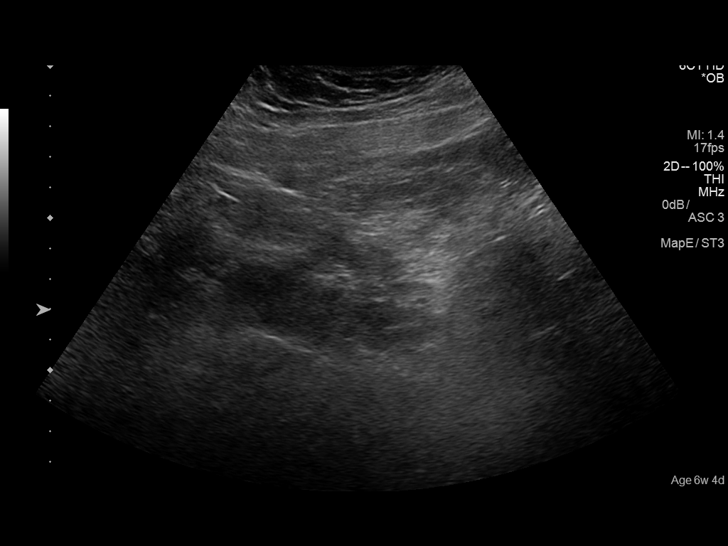
[im 23/69]
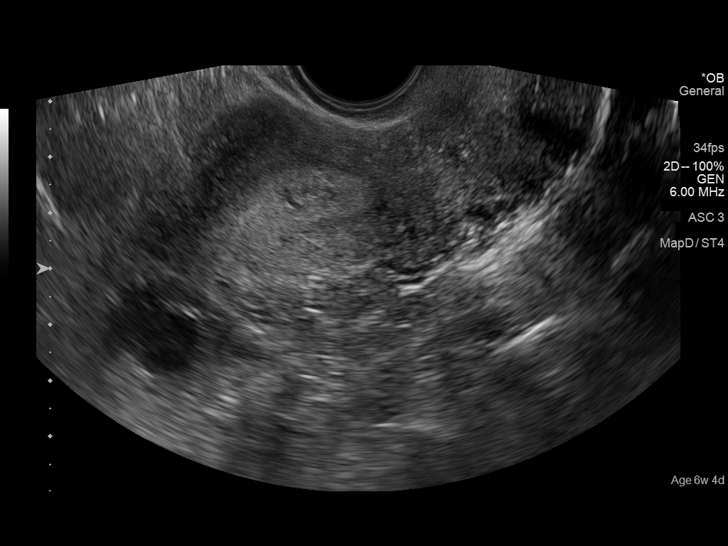
[im 28/69]
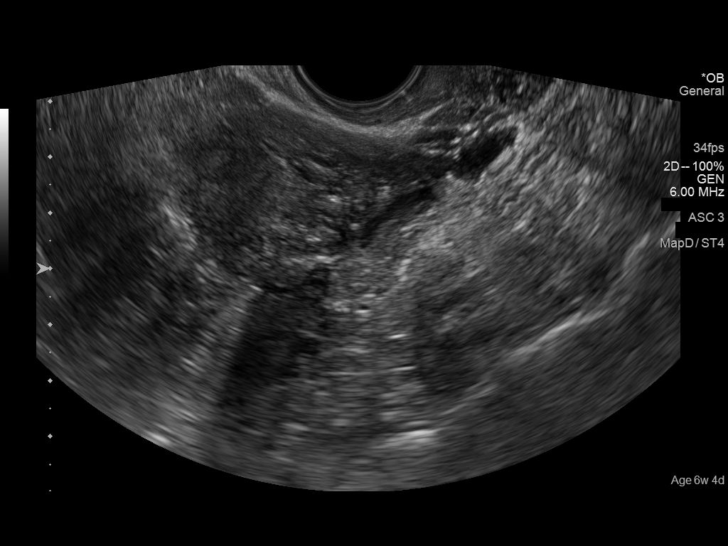
[im 36/69]
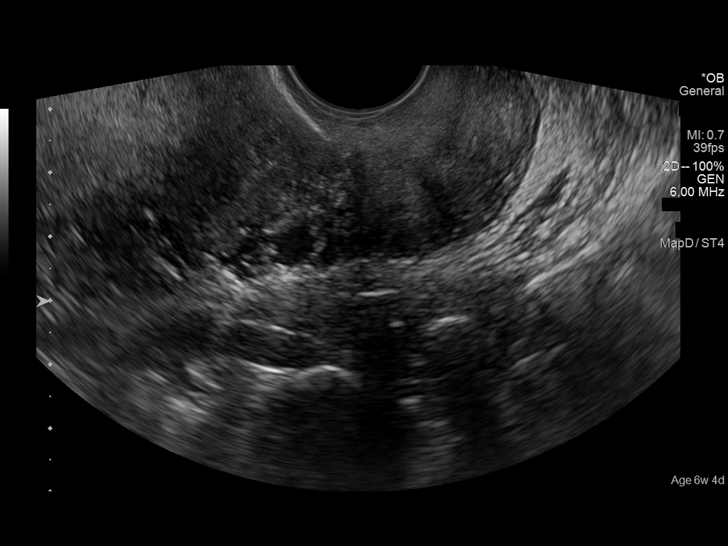
[im 41/69]
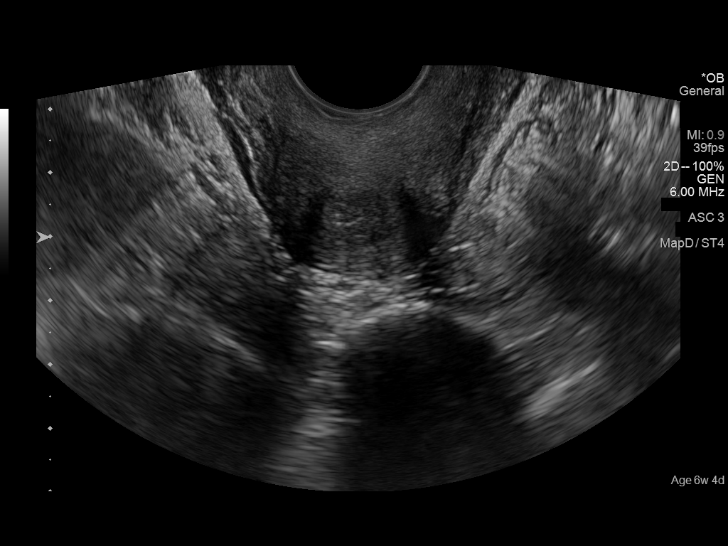
[im 46/69]
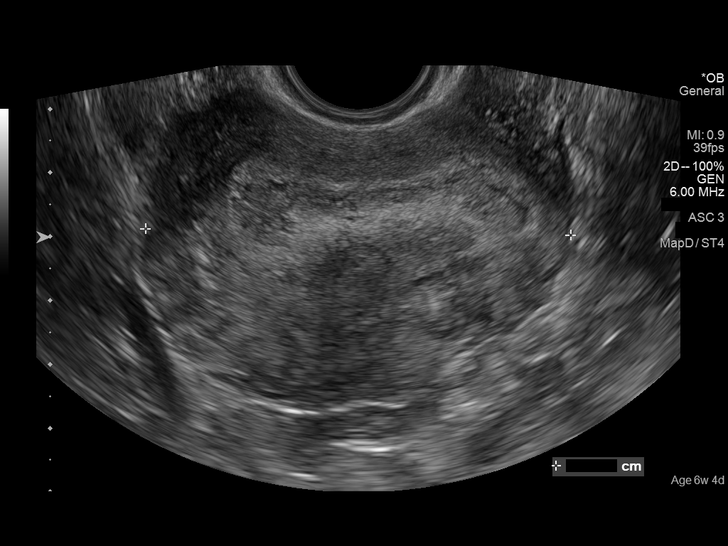
[im 51/69]
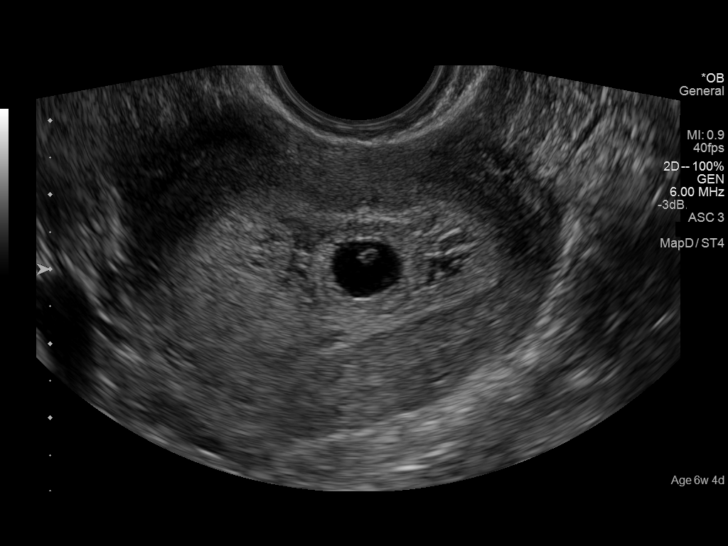
[im 56/69]
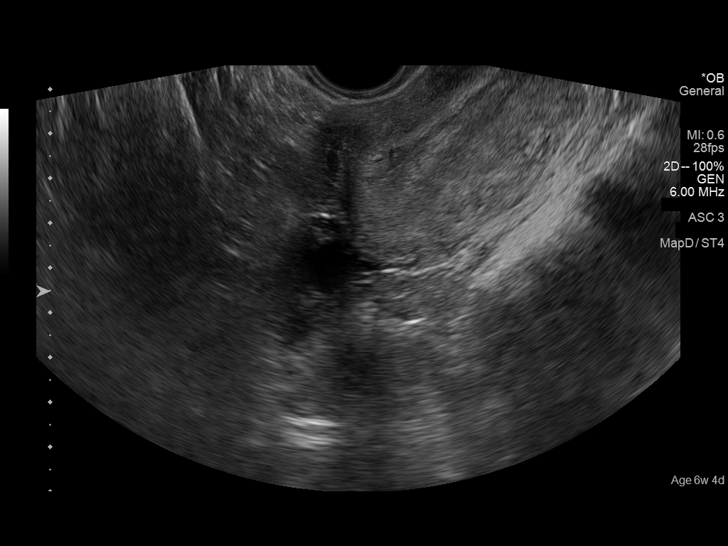
[im 61/69]
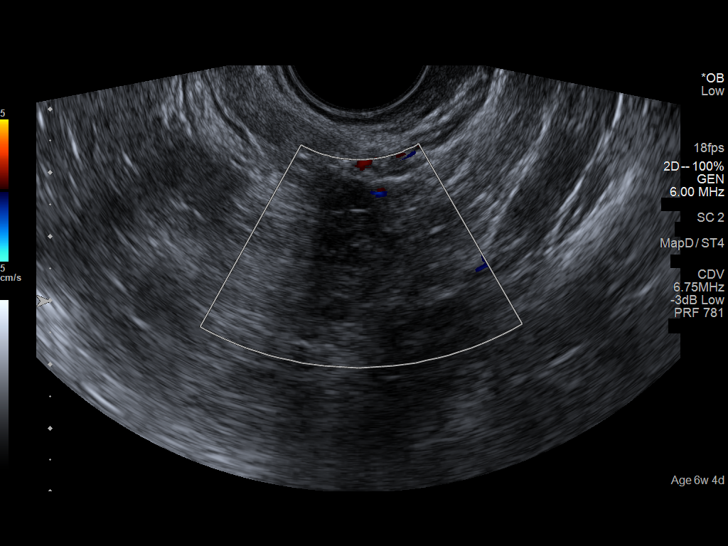
[im 66/69]
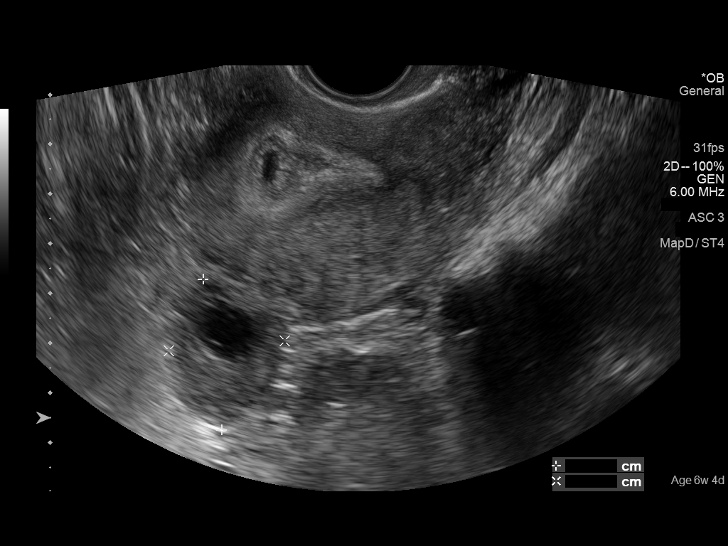

[13 of 28 positions shown; findings below may reference images not displayed]

FINDINGS: Intrauterine gestational sac: Single

Yolk sac:  Visible

Embryo:  Not visible

Cardiac Activity: Not detected

MSD: 9.2  mm   5 w   5  d

Maternal uterus/adnexae:

Possible small volume of subchorionic hemorrhage (image 52).

Lobulated appearance of the dorsal fundus such that an intramural
fibroid is possible measuring about 2.8 cm (image 27).

Trace simple appearing pelvic free fluid.

The right ovary appears normal containing a simple cyst with no
vascular elements. The right ovary measures 3.1 x 2.3 x 2.5 cm. This
cyst is 14 mm. The left ovary is normal measuring 1.4 by 2.7 x
cm.
IMPRESSION: 1. Probable early intrauterine gestational sac with yolk sac, but no
fetal pole, or cardiac activity yet visualized. Recommend follow-up
quantitative B-HCG levels and follow-up US in 14 days to confirm and
assess viability. This recommendation follows SRU consensus
guidelines: Diagnostic Criteria for Nonviable Pregnancy Early in the
First Trimester. N Engl J Med 9846; [DATE].
2. Small volume of subchorionic hemorrhage. Trace simple appearing
pelvic free fluid. Normal ovaries.
3. Questionable intramural dorsal uterine fibroid measuring about
2.8 cm.

## 2017-11-30 ENCOUNTER — Encounter: Payer: Self-pay | Admitting: *Deleted
